# Patient Record
Sex: Female | Born: 1945 | Hispanic: Yes | Smoking: Never smoker
Health system: Southern US, Community
[De-identification: ages and names within clinical notes are randomized; demographics above are authoritative.]

## PROBLEM LIST (undated history)

## (undated) DIAGNOSIS — I10 Essential (primary) hypertension: Secondary | ICD-10-CM

## (undated) DIAGNOSIS — G8929 Other chronic pain: Secondary | ICD-10-CM

## (undated) DIAGNOSIS — C9590 Leukemia, unspecified not having achieved remission: Secondary | ICD-10-CM

## (undated) DIAGNOSIS — M549 Dorsalgia, unspecified: Secondary | ICD-10-CM

## (undated) DIAGNOSIS — M542 Cervicalgia: Secondary | ICD-10-CM

## (undated) HISTORY — PX: CERVICAL SPINE SURGERY: SHX589

---

## 2015-11-29 DIAGNOSIS — Z9889 Other specified postprocedural states: Secondary | ICD-10-CM | POA: Insufficient documentation

## 2015-11-29 DIAGNOSIS — M47812 Spondylosis without myelopathy or radiculopathy, cervical region: Secondary | ICD-10-CM | POA: Insufficient documentation

## 2016-01-01 ENCOUNTER — Encounter (HOSPITAL_COMMUNITY): Payer: Self-pay | Admitting: *Deleted

## 2016-01-01 ENCOUNTER — Emergency Department (HOSPITAL_COMMUNITY): Payer: Medicare Other

## 2016-01-01 ENCOUNTER — Observation Stay (HOSPITAL_COMMUNITY)
Admission: EM | Admit: 2016-01-01 | Discharge: 2016-01-04 | Disposition: A | Discharge status: Home / Self Care | Payer: Medicare Other | Attending: Internal Medicine | Admitting: Internal Medicine

## 2016-01-01 DIAGNOSIS — R079 Chest pain, unspecified: Secondary | ICD-10-CM | POA: Diagnosis present

## 2016-01-01 DIAGNOSIS — R0789 Other chest pain: Principal | ICD-10-CM | POA: Insufficient documentation

## 2016-01-01 DIAGNOSIS — I10 Essential (primary) hypertension: Secondary | ICD-10-CM | POA: Diagnosis not present

## 2016-01-01 DIAGNOSIS — K838 Other specified diseases of biliary tract: Secondary | ICD-10-CM | POA: Insufficient documentation

## 2016-01-01 DIAGNOSIS — C9110 Chronic lymphocytic leukemia of B-cell type not having achieved remission: Secondary | ICD-10-CM

## 2016-01-01 DIAGNOSIS — K219 Gastro-esophageal reflux disease without esophagitis: Secondary | ICD-10-CM | POA: Diagnosis present

## 2016-01-01 DIAGNOSIS — D649 Anemia, unspecified: Secondary | ICD-10-CM | POA: Diagnosis not present

## 2016-01-01 DIAGNOSIS — R0600 Dyspnea, unspecified: Secondary | ICD-10-CM | POA: Diagnosis present

## 2016-01-01 DIAGNOSIS — C9510 Chronic leukemia of unspecified cell type not having achieved remission: Secondary | ICD-10-CM

## 2016-01-01 DIAGNOSIS — C9590 Leukemia, unspecified not having achieved remission: Secondary | ICD-10-CM | POA: Diagnosis present

## 2016-01-01 DIAGNOSIS — R221 Localized swelling, mass and lump, neck: Secondary | ICD-10-CM

## 2016-01-01 DIAGNOSIS — C9112 Chronic lymphocytic leukemia of B-cell type in relapse: Secondary | ICD-10-CM

## 2016-01-01 HISTORY — DX: Dorsalgia, unspecified: M54.9

## 2016-01-01 HISTORY — DX: Other chronic pain: G89.29

## 2016-01-01 HISTORY — DX: Essential (primary) hypertension: I10

## 2016-01-01 HISTORY — DX: Leukemia, unspecified not having achieved remission: C95.90

## 2016-01-01 HISTORY — DX: Cervicalgia: M54.2

## 2016-01-01 LAB — BASIC METABOLIC PANEL
ANION GAP: 8 (ref 5–15)
BUN: 25 mg/dL — ABNORMAL HIGH (ref 6–20)
CALCIUM: 9.6 mg/dL (ref 8.9–10.3)
CO2: 23 mmol/L (ref 22–32)
Chloride: 105 mmol/L (ref 101–111)
Creatinine, Ser: 0.65 mg/dL (ref 0.44–1.00)
Glucose, Bld: 121 mg/dL — ABNORMAL HIGH (ref 65–99)
Potassium: 4.3 mmol/L (ref 3.5–5.1)
SODIUM: 136 mmol/L (ref 135–145)

## 2016-01-01 LAB — URINALYSIS, ROUTINE W REFLEX MICROSCOPIC
BILIRUBIN URINE: NEGATIVE
Glucose, UA: NEGATIVE mg/dL
KETONES UR: NEGATIVE mg/dL
LEUKOCYTES UA: NEGATIVE
NITRITE: NEGATIVE
PROTEIN: NEGATIVE mg/dL
Specific Gravity, Urine: 1.037 — ABNORMAL HIGH (ref 1.005–1.030)
pH: 7 (ref 5.0–8.0)

## 2016-01-01 LAB — CBC
HCT: 28.9 % — ABNORMAL LOW (ref 36.0–46.0)
HEMOGLOBIN: 10 g/dL — AB (ref 12.0–15.0)
MCH: 29.3 pg (ref 26.0–34.0)
MCHC: 34.6 g/dL (ref 30.0–36.0)
MCV: 84.8 fL (ref 78.0–100.0)
PLATELETS: 189 10*3/uL (ref 150–400)
RBC: 3.41 MIL/uL — AB (ref 3.87–5.11)
RDW: 15.1 % (ref 11.5–15.5)
WBC: 48.4 10*3/uL — AB (ref 4.0–10.5)

## 2016-01-01 LAB — URINE MICROSCOPIC-ADD ON

## 2016-01-01 LAB — TROPONIN I: Troponin I: <0.03 ng/mL (ref <0.03)

## 2016-01-01 LAB — I-STAT TROPONIN, ED: TROPONIN I, POC: 0 ng/mL (ref 0.00–0.08)

## 2016-01-01 MED ORDER — ACETAMINOPHEN 325 MG PO TABS
650.0000 mg | ORAL_TABLET | ORAL | Status: DC | PRN
Start: 2016-01-01 — End: 2016-01-04
  Administered 2016-01-02 – 2016-01-03 (×3): 650 mg via ORAL
  Filled 2016-01-01 (×3): qty 2

## 2016-01-01 MED ORDER — NIFEDIPINE ER OSMOTIC RELEASE 30 MG PO TB24
30.0000 mg | ORAL_TABLET | Freq: Every day | ORAL | Status: DC
  Administered 2016-01-02 – 2016-01-04 (×3): 30 mg via ORAL
  Filled 2016-01-01 (×3): qty 1

## 2016-01-01 MED ORDER — PANTOPRAZOLE SODIUM 40 MG PO TBEC
40.0000 mg | DELAYED_RELEASE_TABLET | Freq: Two times a day (BID) | ORAL | Status: DC
  Administered 2016-01-01 – 2016-01-04 (×6): 40 mg via ORAL
  Filled 2016-01-01 (×6): qty 1

## 2016-01-01 MED ORDER — MORPHINE SULFATE (PF) 2 MG/ML IV SOLN
2.0000 mg | INTRAVENOUS | Status: DC | PRN
  Administered 2016-01-02 – 2016-01-03 (×4): 2 mg via INTRAVENOUS
  Filled 2016-01-01 (×5): qty 1

## 2016-01-01 MED ORDER — ONDANSETRON HCL 4 MG/2ML IJ SOLN
4.0000 mg | Freq: Four times a day (QID) | INTRAMUSCULAR | Status: DC | PRN
  Administered 2016-01-02 – 2016-01-03 (×2): 4 mg via INTRAVENOUS
  Filled 2016-01-01 (×2): qty 2

## 2016-01-01 MED ORDER — ASPIRIN 81 MG PO CHEW
324.0000 mg | CHEWABLE_TABLET | Freq: Once | ORAL | Status: AC
  Administered 2016-01-01: 324 mg via ORAL
  Filled 2016-01-01: qty 4

## 2016-01-01 MED ORDER — IOPAMIDOL (ISOVUE-370) INJECTION 76%
INTRAVENOUS | Status: AC
  Administered 2016-01-01: 100 mL
  Filled 2016-01-01: qty 100

## 2016-01-01 MED ORDER — IPRATROPIUM-ALBUTEROL 0.5-2.5 (3) MG/3ML IN SOLN: 3.0000 mL | RESPIRATORY_TRACT | Status: DC | PRN

## 2016-01-01 MED ORDER — ENOXAPARIN SODIUM 40 MG/0.4ML ~~LOC~~ SOLN
40.0000 mg | Freq: Every day | SUBCUTANEOUS | Status: DC
  Administered 2016-01-01 – 2016-01-03 (×3): 40 mg via SUBCUTANEOUS
  Filled 2016-01-01 (×3): qty 0.4

## 2016-01-01 MED ORDER — SODIUM CHLORIDE 0.9 % IV SOLN
INTRAVENOUS | Status: DC
  Administered 2016-01-01: 23:00:00 via INTRAVENOUS

## 2016-01-01 MED ORDER — GI COCKTAIL ~~LOC~~: 30.0000 mL | Freq: Four times a day (QID) | ORAL | Status: DC | PRN

## 2016-01-01 NOTE — H&P (Signed)
History and Physical    Bethany Travis [id number] DOB: 10/27/1945 DOA: 01/01/2016  Referring MD/NP/PA: Dr. Thurnell Garbe PCP: Pcp Not In System  Patient coming from: Family's home that the patient is visiting   Chief Complaint: Dizziness, chest discomfort, and shortness of breath  HPI: Bethany Travis is a 70 y.o. 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 Patient is Spanish-speaking and her nephew acts as a Optometrist. She came to [place] by automobile to visit family from [place], [place].  She reports having this left-sided chest pressure/discomfort whenever standing or exerting herself. Feels like she may pass out, but she has never lost consciousness. Symptoms will last anywhere from 30 minutes to an hour before self resolving. Patient denies trying anything besides resting to alleviate symptoms. Symptoms have not occurred while resting. Associated symptoms during these episodes include fatigue, shortness of breath, palpitations, and dizziness. She denies any cough, hemoptysis, dysuria, abdominal pain, diaphoresis, fevers, calf swelling, or dysuria. Patient denies any significant history of tobacco, alcohol, or illicit drug use. She is not on any blood thinners, but takes a 81 mg aspirin daily.She reports having okay appetite with normal oral intake. Patient has had a known diagnosis of leukemia which she says her primary care provider is following with lab work every 3 months or so. She declined any treatment so because she was fearful of chemotherapy. Of note she is scheduled to have a back operation in the near future.   ED Course: On admission patient was evaluated and seen to be afebrile with vitals otherwise within normal limits.lab work revealed WBC of 40.4, hemoglobin 10,  hematocrit 28.9, platelets 189, BUN 25, creatinine 0.65, glucose 121, and troponin 0. EKG showed no acute abnormalities. Urinalysis was negative. CT angiogram of the chest and due to the history of recent travel, but it did not show any acute signs of a pulmonary embolus, but showed enlarged retropectoral and axillary lymph nodes. TRH called  admit for monitoring overnight.   Review of Systems: As per HPI otherwise 10 point review of systems negative.   Past Medical History  Diagnosis Date  . Hypertension   . Chronic neck and back pain   . Leukemia ([place])     No chemo    Past Surgical History  Procedure Laterality Date  . Cervical spine surgery       reports that she has never smoked. She does not have any smokeless tobacco history on file. Her alcohol and drug histories are not on file.  No Known Allergies  History reviewed. No pertinent family history.  Prior to Admission medications   Not on File    Physical Exam: Constitutional:Elderly female in NAD, calm, comfortable Filed Vitals:   01/01/16 1832 01/01/16 1839 01/01/16 1930 01/01/16 2030  BP:  134/62 118/63 118/52  Pulse: 81 84 84 73  Temp:      TempSrc:      Resp: 20 17 18 19   SpO2: 99% 98% 98% 99%   Eyes: PERRL, lids and conjunctivae normal ENMT: Mucous membranes are moist. Posterior pharynx clear of any exudate or lesions.Normal dentition.  Neck: normal, supple, no masses, no thyromegaly Respiratory: clear to auscultation bilaterally, no wheezing, no crackles. Normal respiratory effort. No accessory muscle use.  Cardiovascular: Regular rate and rhythm, no murmurs / rubs / gallops. No extremity edema. 2+ pedal pulses.  No carotid bruits.  Abdomen: no tenderness, no masses palpated. No hepatosplenomegaly. Bowel sounds positive.  Musculoskeletal: no clubbing / cyanosis. No joint deformity upper and lower extremities. Good ROM, no contractures. Normal muscle tone.  Skin: no rashes, lesions, ulcers. No  induration Neurologic: CN 2-12 grossly intact. Sensation intact, DTR normal. Strength 5/5 in all 4.  Psychiatric: Normal judgment and insight. Alert and oriented x 3. Normal mood.     Labs on Admission: I have personally reviewed following labs and imaging studies  CBC:  Recent Labs Lab 01/01/16 1720  WBC 48.4*  HGB 10.0*  HCT 28.9*  MCV 84.8  PLT 99991111   Basic Metabolic Panel:  Recent Labs Lab 01/01/16 1720  NA 136  K 4.3  CL 105  CO2 23  GLUCOSE 121*  BUN 25*  CREATININE 0.65  CALCIUM 9.6   GFR: CrCl cannot be calculated (Unknown ideal weight.). Liver Function Tests: No results for input(s): AST, ALT, ALKPHOS, BILITOT, PROT, ALBUMIN in the last 168 hours. No results for input(s): LIPASE, AMYLASE in the last 168 hours. No results for input(s): AMMONIA in the last 168 hours. Coagulation Profile: No results for input(s): INR, PROTIME in the last 168 hours. Cardiac Enzymes: No results for input(s): CKTOTAL, CKMB, CKMBINDEX, TROPONINI in the last 168 hours. BNP (last 3 results) No results for input(s): PROBNP in the last 8760 hours. HbA1C: No results for input(s): HGBA1C in the last 72 hours. CBG: No results for input(s): GLUCAP in the last 168 hours. Lipid Profile: No results for input(s): CHOL, HDL, LDLCALC, TRIG, CHOLHDL, LDLDIRECT in the last 72 hours. Thyroid Function Tests: No results for input(s): TSH, T4TOTAL, FREET4, T3FREE, THYROIDAB in the last 72 hours. Anemia Panel: No results for input(s): VITAMINB12, FOLATE, FERRITIN, TIBC, IRON, RETICCTPCT in the last 72 hours. Urine analysis:    Component Value Date/Time   COLORURINE YELLOW 01/01/2016 1940   APPEARANCEUR CLEAR 01/01/2016 1940   LABSPEC 1.037* 01/01/2016 1940   PHURINE 7.0 01/01/2016 1940   GLUCOSEU NEGATIVE 01/01/2016 1940   HGBUR TRACE* 01/01/2016 1940   [place] NEGATIVE 01/01/2016 1940   [place] NEGATIVE 01/01/2016 1940   PROTEINUR NEGATIVE 01/01/2016 1940   NITRITE NEGATIVE  01/01/2016 1940   LEUKOCYTESUR NEGATIVE 01/01/2016 1940   Sepsis Labs: No results found for this or any previous visit (from the past 240 hour(s)).   Radiological Exams on Admission: Dg Chest 2 View  01/01/2016  CLINICAL DATA:  Chest pain for 3 days, dizziness EXAM: CHEST  2 VIEW COMPARISON:  None. FINDINGS: Normal mediastinum and cardiac silhouette. Normal pulmonary vasculature. No evidence of effusion, infiltrate, or pneumothorax. No acute bony abnormality. IMPRESSION: No acute cardiopulmonary process. Electronically Signed   By: Suzy Bouchard M.D.   On: 01/01/2016 17:54   Ct Angio Chest Pe W/cm &/or Wo Cm  01/01/2016  CLINICAL DATA:  Chest pain and shortness of breath for 2 days, history hypertension, leukemia EXAM: CT ANGIOGRAPHY CHEST WITH CONTRAST TECHNIQUE: Multidetector CT imaging of the chest was performed using the standard protocol during bolus administration of intravenous contrast. Multiplanar CT image reconstructions and MIPs were obtained to evaluate the vascular anatomy. CONTRAST:  80 cc Isovue 370 IV COMPARISON:  None FINDINGS: Cardiovascular: Atherosclerotic calcifications aorta without aneurysm or dissection. Pulmonary arteries patent. No evidence of pulmonary embolism. Upper normal size of cardiac chambers. Mediastinum/Nodes: Esophagus unremarkable. Upper normal sized RIGHT retropectoral nodes up to 10 mm and 9 mm diameter. Base of cervical region unremarkable. Numerous normal, upper normal, and borderline enlarged axillary  nodes bilaterally. No mediastinal or hilar adenopathy. Lungs/Pleura: Dependent atelectasis in the posterior lower lobes. Remaining lungs clear without infiltrate, pleural effusion or pneumothorax. Upper Abdomen: Question minimal splenic enlargement though spleen incompletely visualized. Remaining visualized upper abdomen normal appearance. Musculoskeletal: No acute thoracic osseous abnormalities. Review of the MIP images confirms the above findings. IMPRESSION: No  evidence of pulmonary embolism. Dependent atelectasis in the posterior lower lobes bilaterally. Aortic atherosclerosis. Upper normal sized to borderline enlarged retropectoral and axillary lymph nodes. Electronically Signed   By: Lavonia Dana M.D.   On: 01/01/2016 19:36    EKG: Independently reviewed.   Assessment/Plan Chest pain: Acute. Symptoms have been going on over the last 2-3 days with chest discomfort, lightheadedness, and shortness of breath. CT angiogram of chest shows no acute signs of PE.  - Admit to a telemetry bed - Trend cardiac troponins  - Repeat EKG at 6 AM - Check echocardiogram in a.m. - Check lipid panel - May warrant formal consult to cardiology in a.m.  Shortness of breath/dyspnea - Nasal cannula oxygen to keep O2 saturations greater than 92% if needed - DuoNeb's when necessary  Leukocytosis with history of leukemia: WBC count 48.4 on admission. Patient not receiving treatment due to fear chemotherapy. CT angiogram showing enlarged retropectoral and axillary lymph nodes. Question if patient's leukemia is playing a part in her acute presentation and symptoms. - Follow-up repeat CBC with differential in a.m. - May want to curbside oncology in a.m.   Anemia: Hemoglobin 10 on admission. Baseline patient unknown. - Continue to monitor   Question dehydration: With elevated BUN to creatinine ratio. - IV fluids of normal saline at 100 mL per hour - Check orthostatic vital signs - Repeat BMP in a.m.  Essential hypertension - Continue nifedipine  GERD - continue Protonix  DVT prophylaxis: lovenox Code Status: Full Family Communication: Plan discussed with patient's nephew present at bedside Disposition Plan: Possible discharge home depending on pending studies Consults called: None  Admission status: Telemetry observation  Norval Morton MD [facility] Pager [phone]  If 7PM-7AM, please contact night-coverage [email or web address] Password  [email or web address]  01/01/2016, 9:18 PM

## 2016-01-01 NOTE — ED Provider Notes (Signed)
CSN: [id number]     Arrival date & time 01/01/16  1704 History   First MD Initiated Contact with Patient 01/01/16 1759     Chief Complaint  Patient presents with  . Chest Pain  . Dizziness      HPI Pt was seen at 1810.  Per pt and her family, c/o gradual onset and persistence of multiple intermittent episodes of chest "pain" for the past 3 days. Has been associated with SOB, fatigue and lightheadedness. Describes the CP as "pressure." Symptoms worsen on exertion. Symptoms last approximately 1 hour before resolving with rest. Pt endorses hx of recent car travel from [place] to [place] in the past week. Denies palpitations, no back pain, no abd pain, no N/V/D, no cough, no fevers, no calf/LE pain or unilateral swelling.    Past Medical History  Diagnosis Date  . Hypertension   . Chronic neck and back pain   . Leukemia ([place])     No chemo   Past Surgical History  Procedure Laterality Date  . Cervical spine surgery      Social History  Substance Use Topics  . Smoking status: Never Smoker   . Smokeless tobacco: None  . Alcohol Use: None    Review of Systems ROS: Statement: All systems negative except as marked or noted in the HPI; Constitutional: Negative for fever and chills. +generalized fatigue.; ; Eyes: Negative for eye pain, redness and discharge. ; ; ENMT: Negative for ear pain, hoarseness, nasal congestion, sinus pressure and sore throat. ; ; Cardiovascular: +CP, SOB. Negative for palpitations, diaphoresis, and peripheral edema. ; ; Respiratory: Negative for cough, wheezing and stridor. ; ; Gastrointestinal: Negative for nausea, vomiting, diarrhea, abdominal pain, blood in stool, hematemesis, jaundice and rectal bleeding. ; ; Genitourinary: Negative for dysuria, flank pain and hematuria. ; ; Musculoskeletal: Negative for back pain and neck pain. Negative for swelling and trauma.; ; Skin: Negative for pruritus, rash, abrasions, blisters, bruising and skin lesion.; ; Neuro:  +lightheadedness. Negative for headache and neck stiffness. Negative for weakness, altered level of consciousness, altered mental status, extremity weakness, paresthesias, involuntary movement, seizure and syncope.      Allergies  Review of patient's allergies indicates no known allergies.  Home Medications   Prior to Admission medications   Not on File   BP 118/64 mmHg  Pulse 86  Temp(Src) 98 F (36.7 C) (Oral)  Resp 20  SpO2 98%   18:40:15 Orthostatic Vital Signs DT  Orthostatic Lying  - BP- Lying: 123/59 mmHg ; Pulse- Lying: 83  Orthostatic Sitting - BP- Sitting: 134/62 mmHg ; Pulse- Sitting: 79  Orthostatic Standing at 0 minutes - BP- Standing at 0 minutes: 140/96 mmHg ; Pulse- Standing at 0 minutes: 92      Physical Exam  1815: Physical examination:  Nursing notes reviewed; Vital signs and O2 SAT reviewed;  Constitutional: Well developed, Well nourished, Well hydrated, In no acute distress; Head:  Normocephalic, atraumatic; Eyes: EOMI, PERRL, No scleral icterus; ENMT: Mouth and pharynx normal, Mucous membranes moist; Neck: Supple, Full range of motion, No lymphadenopathy; Cardiovascular: Regular rate and rhythm, No gallop; Respiratory: Breath sounds clear & equal bilaterally, No wheezes.  Speaking full sentences with ease, Normal respiratory effort/excursion; Chest: Nontender, Movement normal; Abdomen: Soft, Nontender, Nondistended, Normal bowel sounds; Genitourinary: No CVA tenderness; Extremities: Pulses normal, No tenderness, No edema, No calf edema or asymmetry.; Neuro: AA&Ox3, Major CN grossly intact. No facial droop. Speech clear. No gross focal motor or sensory deficits in extremities.; Skin: Color  normal, Warm, Dry.   ED Course  Procedures (including critical care time) Labs Review  Imaging Review  I have personally reviewed and evaluated these images and lab results as part of my medical decision-making.   EKG Interpretation   Date/Time:  Wednesday January 01 2016  18:37:26 EDT Ventricular Rate:  82 PR Interval:    QRS Duration: 146 QT Interval:  406 QTC Calculation: 475 R Axis:     Text Interpretation:  Sinus arrhythmia Right bundle branch block Baseline  wander Artifact No old tracing to compare Confirmed by [facility]  MD,  Nunzio Cory [phone]) on 01/01/2016 10:21:58 PM      MDM  MDM Reviewed: previous chart, nursing note and vitals Interpretation: labs, ECG, x-ray and CT scan     ED ECG REPORT   Date: 01/01/2016  Rate: 97  Rhythm: normal sinus rhythm and sinus arrhythmia  QRS Axis: normal  Intervals: normal  ST/T Wave abnormalities: normal  Conduction Disutrbances:right bundle branch block  Narrative Interpretation:   Old EKG Reviewed: none available.   Results for orders placed or performed during the hospital encounter of [id number]  Basic metabolic panel  Result Value Ref Range   Sodium 136 135 - 145 mmol/L   Potassium 4.3 3.5 - 5.1 mmol/L   Chloride 105 101 - 111 mmol/L   CO2 23 22 - 32 mmol/L   Glucose, Bld 121 (H) 65 - 99 mg/dL   BUN 25 (H) 6 - 20 mg/dL   Creatinine, Ser 0.65 0.44 - 1.00 mg/dL   Calcium 9.6 8.9 - 10.3 mg/dL   GFR calc non Af Amer >60 >60 mL/min   GFR calc Af Amer >60 >60 mL/min   Anion gap 8 5 - 15  CBC  Result Value Ref Range   WBC 48.4 (H) 4.0 - 10.5 K/uL   RBC 3.41 (L) 3.87 - 5.11 MIL/uL   Hemoglobin 10.0 (L) 12.0 - 15.0 g/dL   HCT 28.9 (L) 36.0 - 46.0 %   MCV 84.8 78.0 - 100.0 fL   MCH 29.3 26.0 - 34.0 pg   MCHC 34.6 30.0 - 36.0 g/dL   RDW 15.1 11.5 - 15.5 %   Platelets 189 150 - 400 K/uL  Urinalysis, Routine w reflex microscopic  Result Value Ref Range   Color, Urine YELLOW YELLOW   APPearance CLEAR CLEAR   Specific Gravity, Urine 1.037 (H) 1.005 - 1.030   pH 7.0 5.0 - 8.0   Glucose, UA NEGATIVE NEGATIVE mg/dL   Hgb urine dipstick TRACE (A) NEGATIVE   Bilirubin Urine NEGATIVE NEGATIVE   Ketones, ur NEGATIVE NEGATIVE mg/dL   Protein, ur NEGATIVE NEGATIVE mg/dL   Nitrite NEGATIVE NEGATIVE    Leukocytes, UA NEGATIVE NEGATIVE  Urine microscopic-add on  Result Value Ref Range   Squamous Epithelial / LPF 0-5 (A) NONE SEEN   WBC, UA 0-5 0 - 5 WBC/hpf   RBC / HPF 0-5 0 - 5 RBC/hpf   Bacteria, UA RARE (A) NONE SEEN  I-stat troponin, ED  Result Value Ref Range   Troponin i, poc 0.00 0.00 - 0.08 ng/mL   Comment 3           Dg Chest 2 View 01/01/2016  CLINICAL DATA:  Chest pain for 3 days, dizziness EXAM: CHEST  2 VIEW COMPARISON:  None. FINDINGS: Normal mediastinum and cardiac silhouette. Normal pulmonary vasculature. No evidence of effusion, infiltrate, or pneumothorax. No acute bony abnormality. IMPRESSION: No acute cardiopulmonary process. Electronically Signed   By: Suzy Bouchard  M.D.   On: 01/01/2016 17:54   Ct Angio Chest Pe W/cm &/or Wo Cm 01/01/2016  CLINICAL DATA:  Chest pain and shortness of breath for 2 days, history hypertension, leukemia EXAM: CT ANGIOGRAPHY CHEST WITH CONTRAST TECHNIQUE: Multidetector CT imaging of the chest was performed using the standard protocol during bolus administration of intravenous contrast. Multiplanar CT image reconstructions and MIPs were obtained to evaluate the vascular anatomy. CONTRAST:  80 cc Isovue 370 IV COMPARISON:  None FINDINGS: Cardiovascular: Atherosclerotic calcifications aorta without aneurysm or dissection. Pulmonary arteries patent. No evidence of pulmonary embolism. Upper normal size of cardiac chambers. Mediastinum/Nodes: Esophagus unremarkable. Upper normal sized RIGHT retropectoral nodes up to 10 mm and 9 mm diameter. Base of cervical region unremarkable. Numerous normal, upper normal, and borderline enlarged axillary nodes bilaterally. No mediastinal or hilar adenopathy. Lungs/Pleura: Dependent atelectasis in the posterior lower lobes. Remaining lungs clear without infiltrate, pleural effusion or pneumothorax. Upper Abdomen: Question minimal splenic enlargement though spleen incompletely visualized. Remaining visualized upper  abdomen normal appearance. Musculoskeletal: No acute thoracic osseous abnormalities. Review of the MIP images confirms the above findings. IMPRESSION: No evidence of pulmonary embolism. Dependent atelectasis in the posterior lower lobes bilaterally. Aortic atherosclerosis. Upper normal sized to borderline enlarged retropectoral and axillary lymph nodes. Electronically Signed   By: Lavonia Dana M.D.   On: 01/01/2016 19:36    2010:  Denies symptoms while in the ED. Not orthostatic on VS. Concerning HPI for ACS as cause for symptoms; will observation admit. Dx and testing d/w pt and family.  Questions answered.  Verb understanding, agreeable to admit.  T/C to [facility]. Tamala Julian, case discussed, including:  HPI, pertinent PM/SHx, VS/PE, dx testing, ED course and treatment:  Agreeable to admit, requests to write temporary orders, obtain observation tele bed to team MCAdmits.   Francine Graven, DO 01/02/16 [phone]

## 2016-01-01 NOTE — ED Notes (Signed)
Pt in c/o chest pain and shortness of breath for the last few days, also dizziness, reports fatigue, shortness of breath is worse with exertion, no distress noted

## 2016-01-02 ENCOUNTER — Observation Stay (HOSPITAL_COMMUNITY): Payer: Medicare Other

## 2016-01-02 ENCOUNTER — Encounter (HOSPITAL_COMMUNITY): Payer: Self-pay | Admitting: Radiology

## 2016-01-02 ENCOUNTER — Observation Stay (HOSPITAL_BASED_OUTPATIENT_CLINIC_OR_DEPARTMENT_OTHER): Payer: Medicare Other

## 2016-01-02 DIAGNOSIS — K219 Gastro-esophageal reflux disease without esophagitis: Secondary | ICD-10-CM | POA: Diagnosis present

## 2016-01-02 DIAGNOSIS — C9590 Leukemia, unspecified not having achieved remission: Secondary | ICD-10-CM | POA: Diagnosis present

## 2016-01-02 DIAGNOSIS — K838 Other specified diseases of biliary tract: Secondary | ICD-10-CM | POA: Diagnosis not present

## 2016-01-02 DIAGNOSIS — R079 Chest pain, unspecified: Secondary | ICD-10-CM

## 2016-01-02 DIAGNOSIS — R0789 Other chest pain: Secondary | ICD-10-CM | POA: Diagnosis not present

## 2016-01-02 DIAGNOSIS — I10 Essential (primary) hypertension: Secondary | ICD-10-CM | POA: Diagnosis not present

## 2016-01-02 DIAGNOSIS — C91Z0 Other lymphoid leukemia not having achieved remission: Secondary | ICD-10-CM | POA: Diagnosis not present

## 2016-01-02 DIAGNOSIS — D598 Other acquired hemolytic anemias: Secondary | ICD-10-CM | POA: Diagnosis not present

## 2016-01-02 DIAGNOSIS — I350 Nonrheumatic aortic (valve) stenosis: Secondary | ICD-10-CM | POA: Diagnosis not present

## 2016-01-02 DIAGNOSIS — D649 Anemia, unspecified: Secondary | ICD-10-CM | POA: Diagnosis present

## 2016-01-02 DIAGNOSIS — D594 Other nonautoimmune hemolytic anemias: Secondary | ICD-10-CM | POA: Diagnosis not present

## 2016-01-02 DIAGNOSIS — R0600 Dyspnea, unspecified: Secondary | ICD-10-CM | POA: Diagnosis present

## 2016-01-02 LAB — CBC WITH DIFFERENTIAL/PLATELET
BASOS ABS: 0 10*3/uL (ref 0.0–0.1)
Basophils Relative: 0 %
EOS PCT: 1 %
Eosinophils Absolute: 0.4 10*3/uL (ref 0.0–0.7)
HEMATOCRIT: 23.8 % — AB (ref 36.0–46.0)
HEMOGLOBIN: 8.2 g/dL — AB (ref 12.0–15.0)
LYMPHS ABS: 28.8 10*3/uL — AB (ref 0.7–4.0)
LYMPHS PCT: 80 %
MCH: 28.7 pg (ref 26.0–34.0)
MCHC: 34.5 g/dL (ref 30.0–36.0)
MCV: 83.2 fL (ref 78.0–100.0)
MONOS PCT: 7 %
Monocytes Absolute: 2.5 10*3/uL — ABNORMAL HIGH (ref 0.1–1.0)
NEUTROS ABS: 4.3 10*3/uL (ref 1.7–7.7)
Neutrophils Relative %: 12 %
Platelets: 156 10*3/uL (ref 150–400)
RBC: 2.86 MIL/uL — ABNORMAL LOW (ref 3.87–5.11)
RDW: 15.1 % (ref 11.5–15.5)
WBC: 36 10*3/uL — ABNORMAL HIGH (ref 4.0–10.5)

## 2016-01-02 LAB — LIPID PANEL
CHOLESTEROL: 143 mg/dL (ref 0–200)
HDL: 32 mg/dL — AB (ref >40)
LDL Cholesterol: 91 mg/dL (ref 0–99)
TRIGLYCERIDES: 102 mg/dL (ref <150)
Total CHOL/HDL Ratio: 4.5 RATIO
VLDL: 20 mg/dL (ref 0–40)

## 2016-01-02 LAB — ECHOCARDIOGRAM COMPLETE
CHL CUP MV DEC (S): 257
EERAT: 13.73
EWDT: 257 ms
FS: 25 % — AB (ref 28–44)
Height: 60 in
IVS/LV PW RATIO, ED: 1.17
LA diam end sys: 42 mm
LA diam index: 2.4 cm/m2
LA vol A4C: 34.4 ml
LASIZE: 42 mm
LDCA: 3.14 cm2
LV TDI E'LATERAL: 9.32
LV e' LATERAL: 9.32 cm/s
LVEEAVG: 13.73
LVEEMED: 13.73
LVOTD: 20 mm
MV pk A vel: 130 m/s
MVPG: 7 mmHg
MVPKEVEL: 128 m/s
PW: 13.3 mm — AB (ref 0.6–1.1)
RV TAPSE: 20.7 mm
TDI e' medial: 5.98
Weight: 2758.4 oz

## 2016-01-02 LAB — COMPREHENSIVE METABOLIC PANEL
ALBUMIN: 3.4 g/dL — AB (ref 3.5–5.0)
ALK PHOS: 66 U/L (ref 38–126)
ALT: 21 U/L (ref 14–54)
ANION GAP: 6 (ref 5–15)
AST: 24 U/L (ref 15–41)
BILIRUBIN TOTAL: 4 mg/dL — AB (ref 0.3–1.2)
BUN: 20 mg/dL (ref 6–20)
CALCIUM: 8.8 mg/dL — AB (ref 8.9–10.3)
CO2: 25 mmol/L (ref 22–32)
Chloride: 107 mmol/L (ref 101–111)
Creatinine, Ser: 0.58 mg/dL (ref 0.44–1.00)
GFR calc Af Amer: >60 mL/min (ref >60)
GLUCOSE: 113 mg/dL — AB (ref 65–99)
Potassium: 3.5 mmol/L (ref 3.5–5.1)
Sodium: 138 mmol/L (ref 135–145)
TOTAL PROTEIN: 5.7 g/dL — AB (ref 6.5–8.1)

## 2016-01-02 LAB — TROPONIN I: Troponin I: <0.03 ng/mL (ref <0.03)

## 2016-01-02 LAB — ABO/RH: ABO/RH(D): A POS

## 2016-01-02 LAB — LACTATE DEHYDROGENASE: LDH: 346 U/L — AB (ref 98–192)

## 2016-01-02 MED ORDER — IOPAMIDOL (ISOVUE-300) INJECTION 61%
INTRAVENOUS | Status: AC
  Administered 2016-01-02: 75 mL
  Filled 2016-01-02: qty 75

## 2016-01-02 MED ORDER — REGADENOSON 0.4 MG/5ML IV SOLN
0.4000 mg | Freq: Once | INTRAVENOUS | Status: AC
  Administered 2016-01-03: 0.4 mg via INTRAVENOUS
  Filled 2016-01-02: qty 5

## 2016-01-02 MED ORDER — PIPERACILLIN-TAZOBACTAM 3.375 G IVPB
3.3750 g | Freq: Three times a day (TID) | INTRAVENOUS | Status: DC
  Administered 2016-01-03 – 2016-01-04 (×4): 3.375 g via INTRAVENOUS
  Filled 2016-01-02 (×6): qty 50

## 2016-01-02 MED ORDER — PIPERACILLIN-TAZOBACTAM 3.375 G IVPB
3.3750 g | Freq: Three times a day (TID) | INTRAVENOUS | Status: DC
  Administered 2016-01-02: 3.375 g via INTRAVENOUS
  Filled 2016-01-02 (×2): qty 50

## 2016-01-02 MED ORDER — SODIUM CHLORIDE 0.9 % IV SOLN: Freq: Once | INTRAVENOUS | Status: DC

## 2016-01-02 MED ORDER — PIPERACILLIN-TAZOBACTAM 3.375 G IVPB 30 MIN: 3.3750 g | Freq: Three times a day (TID) | INTRAVENOUS | Status: DC

## 2016-01-02 NOTE — Care Management Note (Signed)
Case Management Note  Patient Details  Name: Bethany Travis MRN: [id number] Date of Birth: [birth year]  Subjective/Objective:      Chest pain             Action/Plan: Discharge Planning:  NCM spoke to grand-dtr, Kristi at bedside. States pt's son signs all pt documents. Left copy of MOON in room for pt's son to review. Will follow up with to answer any questions regarding notice. Per grand-dtr, pt does not speak [place]. She was scheduled to be in [place] for 3 weeks with plan to travel back to [facility] on Sunday. NCM will continue to follow for dc needs.    Expected Discharge Date:                  Expected Discharge Plan:  Home/Self Care  In-House Referral:  NA  Discharge planning Services  CM Consult  Post Acute Care Choice:  NA Choice offered to:  NA  DME Arranged:  N/A DME Agency:  NA  HH Arranged:  NA HH Agency:  NA  Status of Service:  In process, will continue to follow  If discussed at Long Length of Stay Meetings, dates discussed:    Additional Comments:  Erenest Rasher, RN 01/02/2016, 5:15 PM

## 2016-01-02 NOTE — Care Management Obs Status (Signed)
[facility] NOTIFICATION   Patient Details  Name: Bethany Travis MRN: [id number] Date of Birth: Jan 22, 1946   Medicare Observation Status Notification Given:  Yes    Erenest Rasher, RN 01/02/2016, 5:15 PM

## 2016-01-02 NOTE — Consult Note (Signed)
CARDIOLOGY CONSULT NOTE   Patient ID: Bethany Travis MRN: [id number] DOB/AGE: 1945/11/02 70 y.o.  Admit date: 01/01/2016  Primary Physician   Pcp Not In System Primary Cardiologist   @ [place] Reason for Consultation  Chest pain Requesting Physician  Dr. Coralyn Pear  HPI: Bethany Travis is a 69 y.o. female with a history of AS, HTN, chronic back & pain pain and hx of untreated leukemia who presents for evaluation of dizziness, chest pain/palpitaitons and shortness of breath.   She lives @ [place] and sees cardiologist every 3 months. No hx of cath. Has yearly echo. Last seen 6 months ago. Frequently visit her family in [place] every year. She drove from [place] to [place] last week with family. She is  going back to [place] at the end of August 2017. Patient is Spanish-speaking and her nephew acts as a Optometrist. Hx of palpitation for many years. Told its begins but need frequent monitoring.   His intent to [facility] ER 7/5 for evaluation of worsening dizziness, chest discomfort and shortness of breath for the past 3 days. She complains of left upper chest pain described as achiness questionable associated with palpitation. Dyspnea and dizziness with  exertion. Denies lower extremity edema, orthopnea, PND, syncope, melena, fever, chills. Her symptoms usually last for about 30 minutes and resolves. Recently her blood pressure runs in 160s.   EKG shows sinus rhythm at rate of 97 bpm, right bundle branch block and Q waves in inferior lead and  baseline wandering. Troponin x 3 negative. Lytes normal. Scr normal. LDL 91. WBC 48-->36. Hgb 10-->8.2. Chest x-ray clear. CT angiogram of the chest and due to the history of recent travel, but it did not show any acute signs of a pulmonary embolus, but showed enlarged retropectoral and axillary lymph nodes.   Past Medical History  Diagnosis Date  . Hypertension   . Chronic neck and back pain     . Leukemia ([place])     No chemo     Past Surgical History  Procedure Laterality Date  . Cervical spine surgery      No Known Allergies  I have reviewed the patient's current medications . sodium chloride   Intravenous Once  . enoxaparin (LOVENOX) injection  40 mg Subcutaneous QHS  . NIFEdipine  30 mg Oral Daily  . pantoprazole  40 mg Oral BID     acetaminophen, gi cocktail, ipratropium-albuterol, morphine injection, ondansetron (ZOFRAN) IV  Prior to Admission medications   Medication Sig Start Date End Date Taking? Authorizing Provider  aspirin EC 81 MG tablet Take 81 mg by mouth daily.   Yes Historical Provider, MD  NIFEdipine (PROCARDIA-XL/ADALAT-CC/NIFEDICAL-XL) 30 MG 24 hr tablet Take 30 mg by mouth daily.   Yes Historical Provider, MD  pantoprazole (PROTONIX) 40 MG tablet Take 40 mg by mouth daily.   Yes Historical Provider, MD     Social History   Social History  . Marital Status: Divorced    Spouse Name: N/A  . Number of Children: N/A  . Years of Education: N/A   Occupational History  . Not on file.   Social History Main Topics  . Smoking status: Never Smoker   . Smokeless tobacco: Not on file  . Alcohol Use: Not on file  . Drug Use: Not on file  . Sexual Activity: Not on file   Other Topics Concern  . Not on file   Social History Narrative  . No narrative on file  Family history Father died of some kind arrhythmia. No history of cardiac disease.   ROS:  Full 14 point review of systems complete and found to be negative unless listed above.  Physical Exam: Blood pressure 114/56, pulse 76, temperature 98.8 F (37.1 C), temperature source Oral, resp. rate 18, height 5' (1.524 m), weight 172 lb 6.4 oz (78.2 kg), SpO2 97 %.  General: Well developed, well nourished, female in no acute distress Head: Eyes PERRLA, No xanthomas. Normocephalic and atraumatic, oropharynx without edema or exudate.  Lungs: Resp regular and unlabored, CTA. Heart: RRR no s3,  s4, II/IV murmurs..   Neck: No carotid bruits. No lymphadenopathy. No  JVD. Abdomen: Bowel sounds present, abdomen soft and non-tender without masses or hernias noted. Msk:  No spine or cva tenderness. No weakness, no joint deformities or effusions. Extremities: No clubbing, cyanosis or edema. DP/PT/Radials 2+ and equal bilaterally. Neuro: Alert and oriented X 3. No focal deficits noted. Psych:  Good affect, responds appropriately Skin: No rashes or lesions noted.  Labs:   Lab Results  Component Value Date   WBC 36.0* 01/02/2016   HGB 8.2* 01/02/2016   HCT 23.8* 01/02/2016   MCV 83.2 01/02/2016   PLT 156 01/02/2016   No results for input(s): INR in the last 72 hours.  Recent Labs Lab 01/02/16 0400  NA 138  K 3.5  CL 107  CO2 25  BUN 20  CREATININE 0.58  CALCIUM 8.8*  PROT 5.7*  BILITOT 4.0*  ALKPHOS 66  ALT 21  AST 24  GLUCOSE 113*  ALBUMIN 3.4*   No results found for: MG  Recent Labs  01/01/16 2207 01/02/16 0010 01/02/16 0400  TROPONINI <0.03 <0.03 <0.03    Recent Labs  01/01/16 1725  TROPIPOC 0.00   No results found for: PROBNP Lab Results  Component Value Date   CHOL 143 01/02/2016   HDL 32* 01/02/2016   LDLCALC 91 01/02/2016   TRIG 102 01/02/2016   No results found for: DDIMER No results found for: LIPASE, AMYLASE No results found for: TSH, T4TOTAL, T3FREE, THYROIDAB No results found for: VITAMINB12, FOLATE, FERRITIN, TIBC, IRON, RETICCTPCT  Echo: Pending  ECG:  EKG shows sinus rhythm at rate of 97 bpm, right bundle branch block and Q waves in inferior lead and  baseline wandering. PR interval 162 ms QRS duration 124 ms QT/QTc 366/464 ms P-R-T axes 26 -7 -6  Telemetry: Sinus rhythm at rate of 80s, intermittently goes into 110s   Radiology:  Dg Chest 2 View  01/01/2016  CLINICAL DATA:  Chest pain for 3 days, dizziness EXAM: CHEST  2 VIEW COMPARISON:  None. FINDINGS: Normal mediastinum and cardiac silhouette. Normal pulmonary vasculature.  No evidence of effusion, infiltrate, or pneumothorax. No acute bony abnormality. IMPRESSION: No acute cardiopulmonary process. Electronically Signed   By: Suzy Bouchard M.D.   On: 01/01/2016 17:54   Ct Angio Chest Pe W/cm &/or Wo Cm  01/01/2016  CLINICAL DATA:  Chest pain and shortness of breath for 2 days, history hypertension, leukemia EXAM: CT ANGIOGRAPHY CHEST WITH CONTRAST TECHNIQUE: Multidetector CT imaging of the chest was performed using the standard protocol during bolus administration of intravenous contrast. Multiplanar CT image reconstructions and MIPs were obtained to evaluate the vascular anatomy. CONTRAST:  80 cc Isovue 370 IV COMPARISON:  None FINDINGS: Cardiovascular: Atherosclerotic calcifications aorta without aneurysm or dissection. Pulmonary arteries patent. No evidence of pulmonary embolism. Upper normal size of cardiac chambers. Mediastinum/Nodes: Esophagus unremarkable. Upper normal sized RIGHT retropectoral nodes  up to 10 mm and 9 mm diameter. Base of cervical region unremarkable. Numerous normal, upper normal, and borderline enlarged axillary nodes bilaterally. No mediastinal or hilar adenopathy. Lungs/Pleura: Dependent atelectasis in the posterior lower lobes. Remaining lungs clear without infiltrate, pleural effusion or pneumothorax. Upper Abdomen: Question minimal splenic enlargement though spleen incompletely visualized. Remaining visualized upper abdomen normal appearance. Musculoskeletal: No acute thoracic osseous abnormalities. Review of the MIP images confirms the above findings. IMPRESSION: No evidence of pulmonary embolism. Dependent atelectasis in the posterior lower lobes bilaterally. Aortic atherosclerosis. Upper normal sized to borderline enlarged retropectoral and axillary lymph nodes. Electronically Signed   By: Lavonia Dana M.D.   On: 01/01/2016 19:36    ASSESSMENT AND PLAN:     1. Chest pain - Seems atypical. Could be related to palpitation. Troponin x 3  negative.  EKG shows sinus rhythm at rate of 97 bpm, right bundle branch block and Q waves in inferior lead and  baseline wandering. CTA negative for PE.  Currently chest pain free. Agree with getting echocardiogram. No further ischemic workup unless abnormal echocardiogram. Likely defer invasive evaluation given history of untreated leukemia. Likely medical management.  2. Shortness of breath - Could be related to palpitation/arrhythmia. Patient have a murmur or exam, seems aortic stenosis. Patient endores history of murmur in the past followed by yearly echocardiogram. Pending echo.   3. Palpitations - History for many years. Her palpitation usually occurs once every week and lasts for about a minute or 2. Telemetry shows sinus rhythm at a rate mostly in 80s, however intermittently goes to 110. No arrhythmia noted. Seems her symptoms usually occurs with exertion. Ambulate later today.  4. HTN - Upon presentation blood pressure was 147/74. Improved. Continue nifedipine.   5. Leukemia (HCC)/ Anemia - Per priamry  Signed: Bhagat,Bhavinkumar, PA 01/02/2016, 6:57 AM Pager [phone]  Co-Sign MD  Agree with note by Robbie Lis PA-C  Pt admitted with dizziness, CP and DOE. Visiting family from [place]. Speaks no English. Translation through the courtesy of Dr Coralyn Pear!!! ? H/O AS followed by cardiologists in [place] and [place]. Also H/O untreated leukemia. No prior cardiac Hx.I do not hear AS murmur on exam. EKG SR with RBBB. Enz neg. Labs OK. Tele NSR. Will check 2D echo. If severe AS will need R/L heart cath. Otherwise obs, cycle anz +/- MV  and Rx medically depending on result.   Lorretta Harp, M.D., [place], [facility], [place] [address] [place], [place]  [id number]  [phone] 01/02/2016 8:57 AM

## 2016-01-02 NOTE — Progress Notes (Signed)
*  PRELIMINARY RESULTS* Echocardiogram 2D Echocardiogram has been performed.  Leavy Cella 01/02/2016, 11:05 AM

## 2016-01-02 NOTE — Progress Notes (Addendum)
PROGRESS NOTE    Bethany Travis  [id number] DOB: [birth year] DOA: 01/01/2016 PCP: Pcp Not In System   Brief Narrative:  [name] is a 70 year old female with a past medical history of hypertension, leukemia, originally from the [place] visiting family members in town when she presented with complaints of retrosternal chest pain associated with shortness of breath dizziness and palpitations. She recently drove in from [place], [place]. She reported having history of aortic valve disease and seen a cardiologist in [place] as well as in the [place] Chest x-ray did not reveal acute cardiopulmonary disease. Troponin negative. She was placed in overnight observation as repeat troponins have remained negative. On this mornings evaluation she reported resolution to chest pain symptoms. She was seen and evaluated by cardiology. A transthoracic echocardiogram revealed an ejection fraction of 65-70% without evidence of aortic valve stenosis. Case discussed with Dr. Gwenlyn Found who recommended further workup with stress test   Assessment & Plan:   Principal Problem:   Chest pain Active Problems:   Leukemia ([place])   Dyspnea   Anemia   GERD (gastroesophageal reflux disease)   Essential hypertension   1.  Chest pain. -[name] presenting with a three-day history of retrosternal chest pain associated with shortness of breath. -CT scan of lungs with IV contrast did not reveal evidence for pulmonary embolism. -Troponins were cycled and remained negative 3 sets. -Transthoracic echocardiogram did not reveal wall motion abnormalities, having preserved ejection fraction without valvular abnormalities. -Case was discussed with Dr. Gwenlyn Found of cardiology recommending further workup with stress test. -On my reassessment this afternoon she appears ill developing submandibular swelling. Initiating infectious workup, checking CT scan, starting empiric IV antibiotic therapy,  will need to reassess tomorrow if she would be stable for stress test.  2.  History of leukemia. -She reports having been diagnosed with leukemia in [place] however is currently not undergoing treatment -Lab work showing a white count of 36,000 with predominance of lymphocytes. -CT scan showed borderline enlarged retropectoral and axillary lymph nodes. -Has an increase risk of infection, now developing low-grade fever, undergoing infectious workup  3.  Submandibular swelling -On examination she appear to have bilateral submandibular swelling possibly with localized warmth with associated nodules painful to touch. She reports neck discomfort.  -She had a temp of 100.7, and on my reassessment this afternoon she appeared ill -I wonder about the possibility of an acute infectious process, possibilities could include sialoadenitis or an abscess. She has a history of leukemia that would increase her risk for infection. Labs showing elevated white count of 36,000 this morning with differential showing atypical lymphocytes.  She reports not having undergone treatment for leukemia. -Plan to further workup with CT scan of neck -Will obtain blood cultures and initiate empiric IV antibiotic therapy with Zosyn. Continue IV fluids with normal saline   DVT prophylaxis: SCDs Code Status: Full code Family Communication: I spoke to multiple family members at bedside Disposition Plan:   Consultants:   Cardiology  Procedures:     Antimicrobials:   IV Zosyn started on 01/02/2016   Subjective: On my reassessment this afternoon she reports feeling ill, denies further episodes of chest pain however reports neck pain with painful nodules  Objective: Filed Vitals:   01/02/16 0454 01/02/16 0902 01/02/16 1307 01/02/16 1607  BP: 114/56 117/57  118/60  Pulse: 76 76    Temp: 98.8 F (37.1 C) 98.3 F (36.8 C) 98.9 F (37.2 C) 100.7 F (38.2 C)  TempSrc: Oral Oral  Oral Oral  Resp: 18       Height:      Weight: 78.2 kg (172 lb 6.4 oz)     SpO2: 97% 99%      Intake/Output Summary (Last 24 hours) at 01/02/16 1612 Last data filed at 01/02/16 0449  Gross per 24 hour  Intake 616.67 ml  Output      0 ml  Net 616.67 ml   Filed Weights   01/01/16 2350 01/02/16 0454  Weight: 77.973 kg (171 lb 14.4 oz) 78.2 kg (172 lb 6.4 oz)    Examination:  General exam: Ill-appearing, no acute distress Respiratory system: Clear to auscultation. Respiratory effort normal. Cardiovascular system: S1 & S2 heard, RRR. I did not appreciate murmurs Gastrointestinal system: Abdomen is nondistended, soft and nontender. No organomegaly or masses felt. Normal bowel sounds heard. Central nervous system: Alert and oriented. No focal neurological deficits. Extremities: Symmetric 5 x 5 power. Skin: No rashes, lesions or ulcers Psychiatry: Judgement and insight appear normal. Mood & affect appropriate.     Data Reviewed: I have personally reviewed following labs and imaging studies  CBC:  Recent Labs Lab 01/01/16 1720 01/02/16 0400  WBC 48.4* 36.0*  NEUTROABS  --  4.3  HGB 10.0* 8.2*  HCT 28.9* 23.8*  MCV 84.8 83.2  PLT 189 [id number]   Basic Metabolic Panel:  Recent Labs Lab 01/01/16 1720 01/02/16 0400  NA 136 138  K 4.3 3.5  CL 105 107  CO2 23 25  GLUCOSE 121* 113*  BUN 25* 20  CREATININE 0.65 0.58  CALCIUM 9.6 8.8*   GFR: Estimated Creatinine Clearance: 60.5 mL/min (by C-G formula based on Cr of 0.58). Liver Function Tests:  Recent Labs Lab 01/02/16 0400  AST 24  ALT 21  ALKPHOS 66  BILITOT 4.0*  PROT 5.7*  ALBUMIN 3.4*   No results for input(s): LIPASE, AMYLASE in the last 168 hours. No results for input(s): AMMONIA in the last 168 hours. Coagulation Profile: No results for input(s): INR, PROTIME in the last 168 hours. Cardiac Enzymes:  Recent Labs Lab 01/01/16 2207 01/02/16 0010 01/02/16 0400  TROPONINI <0.03 <0.03 <0.03   BNP (last 3 results) No results  for input(s): PROBNP in the last 8760 hours. HbA1C: No results for input(s): HGBA1C in the last 72 hours. CBG: No results for input(s): GLUCAP in the last 168 hours. Lipid Profile:  Recent Labs  01/02/16 0400  CHOL 143  HDL 32*  LDLCALC 91  TRIG 102  CHOLHDL 4.5   Thyroid Function Tests: No results for input(s): TSH, T4TOTAL, FREET4, T3FREE, THYROIDAB in the last 72 hours. Anemia Panel: No results for input(s): VITAMINB12, FOLATE, FERRITIN, TIBC, IRON, RETICCTPCT in the last 72 hours. Sepsis Labs: No results for input(s): PROCALCITON, LATICACIDVEN in the last 168 hours.  No results found for this or any previous visit (from the past 240 hour(s)).       Radiology Studies: Dg Chest 2 View  01/01/2016  CLINICAL DATA:  Chest pain for 3 days, dizziness EXAM: CHEST  2 VIEW COMPARISON:  None. FINDINGS: Normal mediastinum and cardiac silhouette. Normal pulmonary vasculature. No evidence of effusion, infiltrate, or pneumothorax. No acute bony abnormality. IMPRESSION: No acute cardiopulmonary process. Electronically Signed   By: Suzy Bouchard M.D.   On: 01/01/2016 17:54   Ct Angio Chest Pe W/cm &/or Wo Cm  01/01/2016  CLINICAL DATA:  Chest pain and shortness of breath for 2 days, history hypertension, leukemia EXAM: CT ANGIOGRAPHY CHEST WITH CONTRAST TECHNIQUE:  Multidetector CT imaging of the chest was performed using the standard protocol during bolus administration of intravenous contrast. Multiplanar CT image reconstructions and MIPs were obtained to evaluate the vascular anatomy. CONTRAST:  80 cc Isovue 370 IV COMPARISON:  None FINDINGS: Cardiovascular: Atherosclerotic calcifications aorta without aneurysm or dissection. Pulmonary arteries patent. No evidence of pulmonary embolism. Upper normal size of cardiac chambers. Mediastinum/Nodes: Esophagus unremarkable. Upper normal sized RIGHT retropectoral nodes up to 10 mm and 9 mm diameter. Base of cervical region unremarkable. Numerous  normal, upper normal, and borderline enlarged axillary nodes bilaterally. No mediastinal or hilar adenopathy. Lungs/Pleura: Dependent atelectasis in the posterior lower lobes. Remaining lungs clear without infiltrate, pleural effusion or pneumothorax. Upper Abdomen: Question minimal splenic enlargement though spleen incompletely visualized. Remaining visualized upper abdomen normal appearance. Musculoskeletal: No acute thoracic osseous abnormalities. Review of the MIP images confirms the above findings. IMPRESSION: No evidence of pulmonary embolism. Dependent atelectasis in the posterior lower lobes bilaterally. Aortic atherosclerosis. Upper normal sized to borderline enlarged retropectoral and axillary lymph nodes. Electronically Signed   By: Lavonia Dana M.D.   On: 01/01/2016 19:36        Scheduled Meds: . sodium chloride   Intravenous Once  . enoxaparin (LOVENOX) injection  40 mg Subcutaneous QHS  . NIFEdipine  30 mg Oral Daily  . pantoprazole  40 mg Oral BID  . regadenoson  0.4 mg Intravenous Once   Continuous Infusions:       Time spent: 35 min    Kelvin Cellar, MD [facility] Pager [phone]  If 7PM-7AM, please contact night-coverage [email or web address] Password [email or web address] 01/02/2016, 4:12 PM

## 2016-01-03 ENCOUNTER — Observation Stay (HOSPITAL_COMMUNITY): Payer: Medicare Other

## 2016-01-03 DIAGNOSIS — D598 Other acquired hemolytic anemias: Secondary | ICD-10-CM

## 2016-01-03 DIAGNOSIS — K838 Other specified diseases of biliary tract: Secondary | ICD-10-CM | POA: Diagnosis not present

## 2016-01-03 DIAGNOSIS — R079 Chest pain, unspecified: Secondary | ICD-10-CM | POA: Insufficient documentation

## 2016-01-03 DIAGNOSIS — R590 Localized enlarged lymph nodes: Secondary | ICD-10-CM

## 2016-01-03 DIAGNOSIS — R161 Splenomegaly, not elsewhere classified: Secondary | ICD-10-CM | POA: Diagnosis not present

## 2016-01-03 DIAGNOSIS — D649 Anemia, unspecified: Secondary | ICD-10-CM | POA: Insufficient documentation

## 2016-01-03 DIAGNOSIS — C9110 Chronic lymphocytic leukemia of B-cell type not having achieved remission: Secondary | ICD-10-CM | POA: Diagnosis not present

## 2016-01-03 DIAGNOSIS — I10 Essential (primary) hypertension: Secondary | ICD-10-CM | POA: Diagnosis not present

## 2016-01-03 LAB — CBC WITH DIFFERENTIAL/PLATELET
Basophils Absolute: 0 10*3/uL (ref 0.0–0.1)
Basophils Relative: 0 %
EOS ABS: 0.3 10*3/uL (ref 0.0–0.7)
EOS PCT: 1 %
HCT: 22.2 % — ABNORMAL LOW (ref 36.0–46.0)
Hemoglobin: 7.9 g/dL — ABNORMAL LOW (ref 12.0–15.0)
LYMPHS ABS: 24.2 10*3/uL — AB (ref 0.7–4.0)
Lymphocytes Relative: 74 %
MCH: 29.4 pg (ref 26.0–34.0)
MCHC: 35.6 g/dL (ref 30.0–36.0)
MCV: 82.5 fL (ref 78.0–100.0)
MONO ABS: 2.9 10*3/uL — AB (ref 0.1–1.0)
Monocytes Relative: 9 %
Neutro Abs: 5.2 10*3/uL (ref 1.7–7.7)
Neutrophils Relative %: 16 %
PLATELETS: 140 10*3/uL — AB (ref 150–400)
RBC: 2.69 MIL/uL — AB (ref 3.87–5.11)
RDW: 15.1 % (ref 11.5–15.5)
WBC: 32.6 10*3/uL — AB (ref 4.0–10.5)

## 2016-01-03 LAB — COMPREHENSIVE METABOLIC PANEL
ALT: 30 U/L (ref 14–54)
AST: 27 U/L (ref 15–41)
Albumin: 3.3 g/dL — ABNORMAL LOW (ref 3.5–5.0)
Alkaline Phosphatase: 63 U/L (ref 38–126)
Anion gap: 6 (ref 5–15)
BUN: 19 mg/dL (ref 6–20)
CHLORIDE: 107 mmol/L (ref 101–111)
CO2: 25 mmol/L (ref 22–32)
Calcium: 8.7 mg/dL — ABNORMAL LOW (ref 8.9–10.3)
Creatinine, Ser: 0.74 mg/dL (ref 0.44–1.00)
Glucose, Bld: 124 mg/dL — ABNORMAL HIGH (ref 65–99)
POTASSIUM: 3.5 mmol/L (ref 3.5–5.1)
Sodium: 138 mmol/L (ref 135–145)
Total Bilirubin: 4.1 mg/dL — ABNORMAL HIGH (ref 0.3–1.2)
Total Protein: 5.7 g/dL — ABNORMAL LOW (ref 6.5–8.1)

## 2016-01-03 LAB — NM MYOCAR MULTI W/SPECT W/WALL MOTION / EF
CSEPPHR: 118 {beats}/min
Rest HR: 85 {beats}/min

## 2016-01-03 LAB — DIRECT ANTIGLOBULIN TEST (NOT AT ARMC)
DAT, COMPLEMENT: POSITIVE
DAT, IGG: NEGATIVE

## 2016-01-03 LAB — PATHOLOGIST SMEAR REVIEW

## 2016-01-03 LAB — RETICULOCYTES
RBC.: 2.69 MIL/uL — ABNORMAL LOW (ref 3.87–5.11)
Retic Ct Pct: <0.4 % — ABNORMAL LOW (ref 0.4–3.1)

## 2016-01-03 LAB — PREPARE RBC (CROSSMATCH)

## 2016-01-03 MED ORDER — TECHNETIUM TC 99M TETROFOSMIN IV KIT
30.0000 | PACK | Freq: Once | INTRAVENOUS | Status: AC | PRN
  Administered 2016-01-03: 30 via INTRAVENOUS

## 2016-01-03 MED ORDER — FOLIC ACID 1 MG PO TABS
2.0000 mg | ORAL_TABLET | Freq: Every day | ORAL | Status: DC
  Administered 2016-01-03 – 2016-01-04 (×2): 2 mg via ORAL
  Filled 2016-01-03 (×3): qty 2

## 2016-01-03 MED ORDER — FUROSEMIDE 10 MG/ML IJ SOLN
20.0000 mg | Freq: Once | INTRAMUSCULAR | Status: AC
  Administered 2016-01-03: 20 mg via INTRAVENOUS
  Filled 2016-01-03: qty 2

## 2016-01-03 MED ORDER — SULFAMETHOXAZOLE-TRIMETHOPRIM 800-160 MG PO TABS
1.0000 | ORAL_TABLET | Freq: Every day | ORAL | Status: DC
  Administered 2016-01-03 – 2016-01-04 (×2): 1 via ORAL
  Filled 2016-01-03 (×2): qty 1

## 2016-01-03 MED ORDER — ONDANSETRON HCL 4 MG/2ML IJ SOLN
INTRAMUSCULAR | Status: AC
  Administered 2016-01-03: 4 mg via INTRAVENOUS
  Filled 2016-01-03: qty 2

## 2016-01-03 MED ORDER — GADOBENATE DIMEGLUMINE 529 MG/ML IV SOLN
18.0000 mL | Freq: Once | INTRAVENOUS | Status: AC | PRN
  Administered 2016-01-03: 18 mL via INTRAVENOUS

## 2016-01-03 MED ORDER — SODIUM CHLORIDE 0.9 % IV SOLN
Freq: Once | INTRAVENOUS | Status: AC
Start: 2016-01-03 — End: 2016-01-03
  Administered 2016-01-03: 18:00:00 via INTRAVENOUS

## 2016-01-03 MED ORDER — TECHNETIUM TC 99M TETROFOSMIN IV KIT
10.0000 | PACK | Freq: Once | INTRAVENOUS | Status: AC | PRN
  Administered 2016-01-03: 10 via INTRAVENOUS

## 2016-01-03 MED ORDER — PREDNISONE 20 MG PO TABS
80.0000 mg | ORAL_TABLET | Freq: Every day | ORAL | Status: DC
  Administered 2016-01-03 – 2016-01-04 (×2): 80 mg via ORAL
  Filled 2016-01-03 (×3): qty 4

## 2016-01-03 MED ORDER — REGADENOSON 0.4 MG/5ML IV SOLN
INTRAVENOUS | Status: AC
  Administered 2016-01-03: 0.4 mg via INTRAVENOUS
  Filled 2016-01-03: qty 5

## 2016-01-03 MED ORDER — FLUCONAZOLE 100 MG PO TABS
100.0000 mg | ORAL_TABLET | Freq: Every day | ORAL | Status: DC
  Administered 2016-01-03 – 2016-01-04 (×2): 100 mg via ORAL
  Filled 2016-01-03 (×2): qty 1

## 2016-01-03 NOTE — Consult Note (Signed)
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 Chest Pain  . Dizziness  : She feels very weak.  HPI: The patient is a very charming 70 year old Hispanic female. She is originally [place] She actually lives mostly in [place] with her daughter.  She comes up to [facility] to visit a son.  I talked to her daughter by phone. She said that she has had leukemia for 8-9 years. She has never had any therapy for this. 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 This immediately only leukemia that could be indolent this long.  She says that her mother hasn't had no problems with this. She's never had any illness with this. She's never had any swollen lymph nodes with this. She's never had any weight loss. She's never had any bleeding. She's never required any intervention.  She was up in [place]. She has been feeling quite weak. She has some dizziness and some chest discomfort.  She has had scans done. She had a CT angiogram done. No embolism was noted. She had some mildly enlarged mediastinal lymph nodes. She had numerous borderline enlarged axillary lymph nodes.  On her physical exam, it resolves that she had adenopathy on her neck. She had a CT scan of the neck. She had marked adenopathy throughout her neck.  An MRI of her abdomen was done because of the elevated bilirubin. This showed splenomegaly. She had enlarged abdominal lymph nodes.  Her labs when she came in on July 5 showed a white cell count of 48,000. Hemoglobin 10 and platelet count 189,000. On July 6. Her white cell count 36,000. Hemoglobin 8.2. Platelet count 156,000. She had 12% neutrophils 80% lymphocytes. Her metabolic panel showed a bilirubin of 4. I suspect that this probably is all indirect bilirubinemia. Her BUN is 20 and creatinine 0.58. LDH was done which was mildly elevated at 354.  Echocardiogram was done which  showed an ejection fraction of 65-70%. Otherwise, she has some mild wall thickness. There is no obvious valvular abnormalities.  She's been having temperatures. Her temperatures have been between 101 -  102.  She is not eating all that much right now. There is no nausea or vomiting. She's not having any diarrhea.  Cultures have been taken and so far everything is still pending.  She still is quite fatigued. She seems to be somewhat weak.  We are asked to see her to see if we can help out with the anemia and lymphadenopathy and hopefully try to get her back home to [place].       Past Medical History  Diagnosis Date  . Hypertension   . Chronic neck and back pain   . Leukemia ([place])     No chemo  :  Past Surgical History  Procedure Laterality Date  . Cervical spine surgery    :   Current facility-administered medications:  .  0.9 %  sodium chloride infusion, , Intravenous, Once, Kelvin Cellar, MD .  acetaminophen (TYLENOL) tablet 650 mg, 650 mg, Oral, Q4H PRN, Norval Morton, MD, 650 mg at 01/03/16 0409 .  enoxaparin (LOVENOX) injection 40 mg, 40 mg, Subcutaneous, QHS, Norval Morton, MD, 40 mg at 01/02/16 2045 .  fluconazole (DIFLUCAN) tablet 100 mg, 100 mg, Oral, Daily, Volanda Napoleon, MD .  folic acid (FOLVITE) tablet 2 mg, 2 mg, Oral, Daily, Kelvin Cellar, MD .  gi cocktail (  Maalox,Lidocaine,Donnatal), 30 mL, Oral, QID PRN, Norval Morton, MD .  ipratropium-albuterol (DUONEB) 0.5-2.5 (3) MG/3ML nebulizer solution 3 mL, 3 mL, Nebulization, Q2H PRN, Norval Morton, MD .  morphine 2 MG/ML injection 2 mg, 2 mg, Intravenous, Q2H PRN, Norval Morton, MD, 2 mg at 01/02/16 2044 .  NIFEdipine (PROCARDIA-XL/ADALAT-CC/NIFEDICAL-XL) 24 hr tablet 30 mg, 30 mg, Oral, Daily, Norval Morton, MD, 30 mg at 01/03/16 0739 .  ondansetron (ZOFRAN) injection 4 mg, 4 mg, Intravenous, Q6H PRN, Norval Morton, MD, 4 mg at 01/03/16 [id number] .  pantoprazole (PROTONIX) EC tablet 40 mg, 40 mg,  Oral, BID, Norval Morton, MD, 40 mg at 01/03/16 0738 .  piperacillin-tazobactam (ZOSYN) IVPB 3.375 g, 3.375 g, Intravenous, Q8H, Kelvin Cellar, MD, 3.375 g at 01/03/16 1248 .  predniSONE (DELTASONE) tablet 80 mg, 80 mg, Oral, Daily, Volanda Napoleon, MD .  sulfamethoxazole-trimethoprim (BACTRIM DS,SEPTRA DS) 800-160 MG per tablet 1 tablet, 1 tablet, Oral, Daily, Volanda Napoleon, MD:  . sodium chloride   Intravenous Once  . enoxaparin (LOVENOX) injection  40 mg Subcutaneous QHS  . fluconazole  100 mg Oral Daily  . folic acid  2 mg Oral Daily  . NIFEdipine  30 mg Oral Daily  . pantoprazole  40 mg Oral BID  . piperacillin-tazobactam (ZOSYN)  IV  3.375 g Intravenous Q8H  . predniSONE  80 mg Oral Daily  . sulfamethoxazole-trimethoprim  1 tablet Oral Daily  :  No Known Allergies:  History reviewed. No pertinent family history.:  Social History   Social History  . Marital Status: Divorced    Spouse Name: N/A  . Number of Children: N/A  . Years of Education: N/A   Occupational History  . Not on file.   Social History Main Topics  . Smoking status: Never Smoker   . Smokeless tobacco: Not on file  . Alcohol Use: Not on file  . Drug Use: Not on file  . Sexual Activity: Not on file   Other Topics Concern  . Not on file   Social History Narrative  :  Pertinent items are noted in HPI.  Exam: Patient Vitals for the past 24 hrs:  BP Temp Temp src Pulse Resp SpO2 Weight  01/03/16 1506 (!) 102/45 mmHg 100.1 F (37.8 C) Oral 93 (!) 21 92 % -  01/03/16 0956 133/61 mmHg - - - - - -  01/03/16 0954 (!) 159/61 mmHg - - - - - -  01/03/16 0953 103/62 mmHg - - - - - -  01/03/16 0950 110/63 mmHg - - - - - -  01/03/16 0924 112/65 mmHg - - - - - -  01/03/16 0630 - 99 F (37.2 C) Oral - - - -  01/03/16 0407 (!) 93/35 mmHg (!) 100.5 F (38.1 C) Oral - - - 172 lb 2.9 oz (78.1 kg)  01/02/16 2130 - 99 F (37.2 C) - - - - -  01/02/16 2058 (!) 117/44 mmHg 100.1 F (37.8 C) Oral 91 - 95 %  -   Slightly ill-appearing Hispanic female in no obvious distress. Head exam shows obvious cervical lymphadenopathy. Lymph nodes are quite large and nontender and mobile. There is no scleral icterus. There is no oral lesions. I don't see any enlarged tonsils. Lungs are slightly decreased at the bases. Cardiac exam regular rate and rhythm with no murmurs, rubs or bruits. Abdomen is soft. Abdomen somewhat obese. She has no fluid wave. There is no obvious hepatomegaly. I cannot palpate  a spleen tip. Back exam is unremarkable. Extremities shows some trace edema in her legs. Skin exam shows no rashes, ecchymoses or petechia. Neurological exam shows no focal neurological deficits.    Recent Labs  01/02/16 0400 01/03/16 0350  WBC 36.0* 32.6*  HGB 8.2* 7.9*  HCT 23.8* 22.2*  PLT 156 140*    Recent Labs  01/02/16 0400 01/03/16 0350  NA 138 138  K 3.5 3.5  CL 107 107  CO2 25 25  GLUCOSE 113* 124*  BUN 20 19  CREATININE 0.58 0.74  CALCIUM 8.8* 8.7*    Blood smear review:  None  Pathology: None     Assessment and Plan:  Mrs.Cabrejas-Desuarez is a nice 70 year old Hispanic female. She has a history of CLL. She has not required therapy as she has been asymptomatic.  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 She has a lot of the manifestations of this area and the fevers I think reflect the transformation. The enlarging lymph nodes are also consistent with this.  She had a direct Coombs test which is positive. However, her reticulocyte count is incredibly low. As such, I think that she has a situation which she has some autoimmune hemolytic process but it cannot not a reticulocyte count because I suspect her marrow is packed with leukemia cells.  Her daughter was incredibly helpful with information. Her daughter actually lives close to the [facility] which has a very good cancer  [facility].  I think that we have to get her on prednisone right now. Again I think the fevers are not reflective of infection but I think are indicative of Richter's transformation. I will get her on 80 mg a day. I think this also may help with the anemia.  She clearly needs folic acid. I would have her on 2 mg a day of folic acid.  I would definitely transfuse her. I actually would consider 2 units of blood for her area and at first I thought maybe one unit but given her low reticulocyte count, I don't think that she will be able to mount a response for a while.  Hopefully, we can get her feeling better so she can get down to [place]. Her daughter would like to get her home.  Ultimately, a lymph node biopsy will help. This will be done closer to home if she does go home.  This is a very fascinating case. 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 I told her daughter this. I think she understands pretty well. Her daughter was incredibly eloquent and quite astute when I was talking to her.  I spent about an hour with the patient and her grandson. She seems to be incredibly nice.  Pete E.  Isaiah 41:10

## 2016-01-03 NOTE — Progress Notes (Signed)
    Stress test was normal. Patient was very relieved. Cardiology will sign off.   Angelena Form PA-C  [facility]

## 2016-01-03 NOTE — Progress Notes (Signed)
Patient Name: Bethany Travis Date of Encounter: 01/03/2016     Principal Problem:   Chest pain Active Problems:   Leukemia ([place])   Dyspnea   Anemia   GERD (gastroesophageal reflux disease)   Essential hypertension    SUBJECTIVE  No CP or SOB. Seen in nuc med for stress test.   CURRENT MEDS . sodium chloride   Intravenous Once  . enoxaparin (LOVENOX) injection  40 mg Subcutaneous QHS  . NIFEdipine  30 mg Oral Daily  . pantoprazole  40 mg Oral BID  . piperacillin-tazobactam (ZOSYN)  IV  3.375 g Intravenous Q8H    OBJECTIVE  Filed Vitals:   01/02/16 2130 01/03/16 0407 01/03/16 0630 01/03/16 0924  BP:  93/35  112/65  Pulse:      Temp: 99 F (37.2 C) 100.5 F (38.1 C) 99 F (37.2 C)   TempSrc:  Oral Oral   Resp:      Height:      Weight:  172 lb 2.9 oz (78.1 kg)    SpO2:        Intake/Output Summary (Last 24 hours) at 01/03/16 0940 Last data filed at 01/03/16 0900  Gross per 24 hour  Intake      0 ml  Output      0 ml  Net      0 ml   Filed Weights   01/01/16 2350 01/02/16 0454 01/03/16 0407  Weight: 171 lb 14.4 oz (77.973 kg) 172 lb 6.4 oz (78.2 kg) 172 lb 2.9 oz (78.1 kg)    PHYSICAL EXAM  General: Pleasant, NAD. Neuro: Alert and oriented X 3. Moves all extremities spontaneously. Psych: Normal affect. HEENT:  Normal  Neck: Supple without bruits or JVD. Lungs:  Resp regular and unlabored, CTA. Heart: RRR no s3, s4, or murmurs. Abdomen: Soft, non-tender, non-distended, BS + x 4.  Extremities: No clubbing, cyanosis or edema. DP/PT/Radials 2+ and equal bilaterally.  Accessory Clinical Findings  CBC  Recent Labs  01/02/16 0400 01/03/16 0350  WBC 36.0* 32.6*  NEUTROABS 4.3 5.2  HGB 8.2* 7.9*  HCT 23.8* 22.2*  MCV 83.2 82.5  PLT 156 [id number]*   Basic Metabolic Panel  Recent Labs  01/02/16 0400 01/03/16 0350  NA 138 138  K 3.5 3.5  CL 107 107  CO2 25 25  GLUCOSE 113* 124*  BUN 20 19  CREATININE 0.58 0.74  CALCIUM 8.8* 8.7*     Liver Function Tests  Recent Labs  01/02/16 0400 01/03/16 0350  AST 24 27  ALT 21 30  ALKPHOS 66 63  BILITOT 4.0* 4.1*  PROT 5.7* 5.7*  ALBUMIN 3.4* 3.3*   No results for input(s): LIPASE, AMYLASE in the last 72 hours. Cardiac Enzymes  Recent Labs  01/01/16 2207 01/02/16 0010 01/02/16 0400  TROPONINI <0.03 <0.03 <0.03  Fasting Lipid Panel  Recent Labs  01/02/16 0400  CHOL 143  HDL 32*  LDLCALC 91  TRIG 102  CHOLHDL 4.5   Thyroid Function Tests No results for input(s): TSH, T4TOTAL, T3FREE, THYROIDAB in the last 72 hours.  Invalid input(s): FREET3  TELE  NSR  Radiology/Studies  Dg Chest 2 View  01/01/2016  CLINICAL DATA:  Chest pain for 3 days, dizziness EXAM: CHEST  2 VIEW COMPARISON:  None. FINDINGS: Normal mediastinum and cardiac silhouette. Normal pulmonary vasculature. No evidence of effusion, infiltrate, or pneumothorax. No acute bony abnormality. IMPRESSION: No acute cardiopulmonary process. Electronically Signed   By: Suzy Bouchard M.D.   On: 01/01/2016  17:54   Ct Soft Tissue Neck W Contrast  01/02/2016  CLINICAL DATA:  70 year old female with history of leukemia and neck swelling. Initial encounter. EXAM: CT NECK WITH CONTRAST TECHNIQUE: Multidetector CT imaging of the neck was performed using the standard protocol following the bolus administration of intravenous contrast. CONTRAST:  1 ISOVUE-300 IOPAMIDOL (ISOVUE-300) INJECTION 61% COMPARISON:  None. FINDINGS: Pharynx and larynx: Posterior superior pharyngeal enlargement with 2.4 x 2 x 2.2 cm low-density component. Although large Thornwaldt cyst is a possibility, necrotic adenoidal tissue/mass or abscess are also considerations. No erosion of the adjacent clivus. Symmetric mild prominence of the palatine tonsils. Salivary glands: Adenopathy within the parotid glands greater on the left. Thyroid: No dominant mass. Lymph nodes: Marked neck adenopathy involving all stations. Adenopathy occipital region.  Adenopathy axilla and increase number superior mediastinal/ supraclavicular lymph nodes. Vascular: Aortic atherosclerosis. Tortuous carotid arteries. Carotid bifurcation calcifications without hemodynamically significant stenosis. No thrombosis of the internal jugular veins which are slightly narrowed by adjacent adenopathy. Limited intracranial: No enhancing lesion. Visualized orbits: Mild exophthalmos. Mastoids and visualized paranasal sinuses: Negative. Skeleton: Cervical spondylotic changes. Post extensive decompression C2 through C6. Cord may be tethered posteriorly but incompletely assessed. Upper chest: No dominant mass.  Minimal atelectatic changes. IMPRESSION: Marked neck adenopathy involving all stations. Adenopathy occipital region. Adenopathy axilla and increase number superior mediastinal/ supraclavicular lymph nodes. Findings highly suspicious for lymphoma. Posterior superior pharyngeal enlargement with 2.4 x 2 x 2.2 cm low-density component. Although large Thornwaldt cyst is a possibility, necrotic adenoidal tissue/mass or abscess are also considerations. No erosion of the adjacent clivus. Symmetric mild prominence of the palatine tonsils. Postsurgical changes cervical spine as noted above. Electronically Signed   By: Genia Del M.D.   On: 01/02/2016 20:07   Ct Angio Chest Pe W/cm &/or Wo Cm  01/01/2016  CLINICAL DATA:  Chest pain and shortness of breath for 2 days, history hypertension, leukemia EXAM: CT ANGIOGRAPHY CHEST WITH CONTRAST TECHNIQUE: Multidetector CT imaging of the chest was performed using the standard protocol during bolus administration of intravenous contrast. Multiplanar CT image reconstructions and MIPs were obtained to evaluate the vascular anatomy. CONTRAST:  80 cc Isovue 370 IV COMPARISON:  None FINDINGS: Cardiovascular: Atherosclerotic calcifications aorta without aneurysm or dissection. Pulmonary arteries patent. No evidence of pulmonary embolism. Upper normal size of  cardiac chambers. Mediastinum/Nodes: Esophagus unremarkable. Upper normal sized RIGHT retropectoral nodes up to 10 mm and 9 mm diameter. Base of cervical region unremarkable. Numerous normal, upper normal, and borderline enlarged axillary nodes bilaterally. No mediastinal or hilar adenopathy. Lungs/Pleura: Dependent atelectasis in the posterior lower lobes. Remaining lungs clear without infiltrate, pleural effusion or pneumothorax. Upper Abdomen: Question minimal splenic enlargement though spleen incompletely visualized. Remaining visualized upper abdomen normal appearance. Musculoskeletal: No acute thoracic osseous abnormalities. Review of the MIP images confirms the above findings. IMPRESSION: No evidence of pulmonary embolism. Dependent atelectasis in the posterior lower lobes bilaterally. Aortic atherosclerosis. Upper normal sized to borderline enlarged retropectoral and axillary lymph nodes. Electronically Signed   By: Lavonia Dana M.D.   On: 01/01/2016 19:36   US Abdomen Limited Ruq  01/02/2016  CLINICAL DATA:  Hyperbilirubinemia, history hypertension, leukemia EXAM: US ABDOMEN LIMITED - RIGHT UPPER QUADRANT COMPARISON:  None FINDINGS: Gallbladder: Remote cholecystectomy by history 30+ years ago. Common bile duct: Diameter: 10 mm diameter, dilated.  Distal CBD not visualized. Liver: Normal echogenicity. No definite mass lesion or nodularity. No significant intrahepatic biliary dilatation. Hepatopetal portal venous flow. Grossly smooth hepatic contours. No RIGHT upper  quadrant free fluid. IMPRESSION: Extrahepatic biliary dilatation with CBD 10 mm diameter, question physiologic dilatation due to remote cholecystectomy versus distal CBD obstruction ; recommend correlation with LFTs. Consider follow-up MRCP imaging to exclude distal CBD obstruction. Electronically Signed   By: Lavonia Dana M.D.   On: 01/02/2016 18:43    2D ECHO: 01/02/2016 LV EF: 65% - 70% Study Conclusions - Left ventricle: The cavity  size was normal. Wall thickness was  increased in a pattern of moderate LVH. Systolic function was  vigorous. The estimated ejection fraction was in the range of 65%  to 70%. Wall motion was normal; there were no regional wall  motion abnormalities. Left ventricular diastolic function  parameters were normal. - Left atrium: The atrium was mildly dilated. - Atrial septum: No defect or patent foramen ovale was identified.    ASSESSMENT AND PLAN Jinx Carrozza is a 70 y.o. female with a history of AS, HTN, chronic back & pain pain and hx of untreated leukemia who presented to [facility] on 01/01/16 for evaluation of dizziness, chest pain/palpitaitons and shortness of breath.  Chest pain/SOB: felt to be atypical. May be 2/2 worsening anemia Ruled out for MI. CTA negative for PE.2D ECHO with normal EF and no WMA. Currently chest pain free. Seen in nuc med today for lexiscan myoview.   Hx of AS: 2D ECHO yesterday with trileaflet valve with no AS or AR.  Palpitations: History for many years. Her palpitation usually occurs once every week and lasts for about a minute or 2. Telemetry shows sinus rhythm at a rate mostly in 80s, however intermittently goes to 110. No arrhythmia noted.   HTN: upon presentation blood pressure was 147/74. Improved now. Continue nifedipine.   Leukemia (HCC)/ Anemia: per primary  Submandibular swelling: with fevers and leukocytosis. CT neck with " Marked neck adenopathy involving all stations. Adenopathy occipital region. Adenopathy axilla and increase number superior mediastinal/supraclavicular lymph nodes. Findings highly suspicious for Lymphoma. Posterior superior pharyngeal enlargement with 2.4 x 2 x 2.2 cm low-density component. Although large Thornwaldt cyst is a possibility, necrotic adenoidal tissue/mass or abscess are also considerations"  Signed, Angelena Form PA-C  Pager [phone] Agree with note by Nell Range PA-C  Nl 2D. No AS. Had  Lexiscan this AM. Results pending. W/U for lymphadenopathy and GB on going.   Lorretta Harp, M.D., [place], [facility], [place] [address]. [place], [place]  [id number]  [phone] 01/03/2016 10:36 AM

## 2016-01-03 NOTE — Progress Notes (Addendum)
PROGRESS NOTE    Bethany Travis  [id number] DOB: 09-09-1945 DOA: 01/01/2016 PCP: Pcp Not In System   Brief Narrative:  Bethany Travis is a 70 year old female with a past medical history of hypertension, leukemia, originally from [place] visiting family members in town when she presented with complaints of retrosternal chest pain associated with shortness of breath dizziness and palpitations. She recently drove in from [place], [place]. She reported having history of aortic valve disease and seen a cardiologist in [place] as well as in the [place] Chest x-ray did not reveal acute cardiopulmonary disease. Troponin negative. She was placed in overnight observation as repeat troponins have remained negative. On this mornings evaluation she reported resolution to chest pain symptoms. She was seen and evaluated by cardiology. A transthoracic echocardiogram revealed an ejection fraction of 65-70% without evidence of aortic valve stenosis. Case discussed with Dr. Gwenlyn Found who recommended further workup with stress test   Assessment & Plan:   Principal Problem:   Chest pain Active Problems:   Leukemia ([place])   Dyspnea   Anemia   GERD (gastroesophageal reflux disease)   Essential hypertension   1.  Chest pain. -Bethany Travis presenting with a three-day history of retrosternal chest pain associated with shortness of breath. -CT scan of lungs with IV contrast did not reveal evidence for pulmonary embolism. -Troponins were cycled and remained negative 3 sets. -Transthoracic echocardiogram did not reveal wall motion abnormalities, having preserved ejection fraction without valvular abnormalities. -Stress test performed on 01/03/2016 did not reveal evidence of reversible ischemia  2.  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 -She reports having been diagnosed with leukemia in [place] however is currently not undergoing treatment -Lab work showing a white count  of 36,000 with predominance of lymphocytes. -On 01/02/2016 she developed low-grade fever was started on empiric IV antibiotic therapy with Zosyn. -This was further worked up with a soft tissue CT scan of the neck performed on 01/02/2016 that revealed innumerable neck adenopathy consistent with lymphoma. Also noted was superior pharyngeal enlargement with 2. 2 x 2 by 2.2 cm low-density components that could represent necrotic adenoidal tissue/mass versus abscess. These findings were discussed with ENT who felt this likely represented adenoidal tissue. He did not recommend needle aspiration for the time being. -When medically stable for discharge she plans to fly back down to [facility] and see her oncologist to discuss further treatment. -With regard to low-grade fever of plan to treat with IV Zosyn for the next 24 hours and follow-up on culture data. Reassess in a.m.  3.  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 Her LDH was 346. -I discussed this with Dr. Marin Olp of medical oncology who felt this could be related to hemolytic anemia and recommended checking a Coombs test along with reticulocyte count and haptoglobin. He also recommended transfusing 1 unit of packed red blood cells and starting folate 2 mg by mouth daily -If Coombs test comes back positive she may need to be started on steroids.   4.  Hyperbilirubinemia -Lab work showing elevated bilirubin of 4.1. -This had been further worked up with right upper quadrant ultrasound that showed common bile duct measuring 10 mm in diameter. It was unclear if this was related to remote cholecystectomy or to distal common bile duct obstruction, radiology recommended MRCP to exclude obstruction. -MRCP performed on 01/03/2016 did not reveal choledocholithiasis or obstructing mass. Pancreas had normal appearance and pancreatic duct appearing normal as well. -Right upper quadrant  ultrasound had shown normal echogenicity of  liver without definite mass lesions or nodularity. -Could be related to hemolytic anemia  5.  Hypertension -Stable, continue nifedipine at 30 mg by mouth daily  DVT prophylaxis: SCDs Code Status: Full code Family Communication: I spoke to multiple family members at bedside Disposition Plan:   Consultants:   Cardiology  Procedures:   Stress test performed on 01/03/2016  Antimicrobials:   IV Zosyn started on 01/02/2016   Subjective: She states she will better although continues to have some neck pain  Objective: Filed Vitals:   01/03/16 0953 01/03/16 0954 01/03/16 0956 01/03/16 1506  BP: 103/62 159/61 133/61 102/45  Pulse:    93  Temp:    100.1 F (37.8 C)  TempSrc:    Oral  Resp:    21  Height:      Weight:      SpO2:    92%    Intake/Output Summary (Last 24 hours) at 01/03/16 1544 Last data filed at 01/03/16 0900  Gross per 24 hour  Intake      0 ml  Output      0 ml  Net      0 ml   Filed Weights   01/01/16 2350 01/02/16 0454 01/03/16 0407  Weight: 77.973 kg (171 lb 14.4 oz) 78.2 kg (172 lb 6.4 oz) 78.1 kg (172 lb 2.9 oz)    Examination:  General exam: She appears mildly icteric Respiratory system: Clear to auscultation. Respiratory effort normal. Cardiovascular system: S1 & S2 heard, RRR. I did not appreciate murmurs Gastrointestinal system: Abdomen is nondistended, soft and nontender. No organomegaly or masses felt. Normal bowel sounds heard. Central nervous system: Alert and oriented. No focal neurological deficits. Extremities: Symmetric 5 x 5 power. Skin: No rashes, lesions or ulcers Psychiatry: Judgement and insight appear normal. Mood & affect appropriate.     Data Reviewed: I have personally reviewed following labs and imaging studies  CBC:  Recent Labs Lab 01/01/16 1720 01/02/16 0400 01/03/16 0350  WBC 48.4* 36.0* 32.6*  NEUTROABS  --  4.3 5.2  HGB 10.0* 8.2* 7.9*  HCT 28.9* 23.8* 22.2*  MCV 84.8 83.2 82.5  PLT 189 156 140*    Basic Metabolic Panel:  Recent Labs Lab 01/01/16 1720 01/02/16 0400 01/03/16 0350  NA 136 138 138  K 4.3 3.5 3.5  CL 105 107 107  CO2 23 25 25   GLUCOSE 121* 113* 124*  BUN 25* 20 19  CREATININE 0.65 0.58 0.74  CALCIUM 9.6 8.8* 8.7*   GFR: Estimated Creatinine Clearance: 60.4 mL/min (by C-G formula based on Cr of 0.74). Liver Function Tests:  Recent Labs Lab 01/02/16 0400 01/03/16 0350  AST 24 27  ALT 21 30  ALKPHOS 66 63  BILITOT 4.0* 4.1*  PROT 5.7* 5.7*  ALBUMIN 3.4* 3.3*   No results for input(s): LIPASE, AMYLASE in the last 168 hours. No results for input(s): AMMONIA in the last 168 hours. Coagulation Profile: No results for input(s): INR, PROTIME in the last 168 hours. Cardiac Enzymes:  Recent Labs Lab 01/01/16 2207 01/02/16 0010 01/02/16 0400  TROPONINI <0.03 <0.03 <0.03   BNP (last 3 results) No results for input(s): PROBNP in the last 8760 hours. HbA1C: No results for input(s): HGBA1C in the last 72 hours. CBG: No results for input(s): GLUCAP in the last 168 hours. Lipid Profile:  Recent Labs  01/02/16 0400  CHOL 143  HDL 32*  LDLCALC 91  TRIG 102  CHOLHDL 4.5  Thyroid Function Tests: No results for input(s): TSH, T4TOTAL, FREET4, T3FREE, THYROIDAB in the last 72 hours. Anemia Panel: No results for input(s): VITAMINB12, FOLATE, FERRITIN, TIBC, IRON, RETICCTPCT in the last 72 hours. Sepsis Labs: No results for input(s): PROCALCITON, LATICACIDVEN in the last 168 hours.  No results found for this or any previous visit (from the past 240 hour(s)).       Radiology Studies: Dg Chest 2 View  01/01/2016  CLINICAL DATA:  Chest pain for 3 days, dizziness EXAM: CHEST  2 VIEW COMPARISON:  None. FINDINGS: Normal mediastinum and cardiac silhouette. Normal pulmonary vasculature. No evidence of effusion, infiltrate, or pneumothorax. No acute bony abnormality. IMPRESSION: No acute cardiopulmonary process. Electronically Signed   By: Suzy Bouchard M.D.   On: 01/01/2016 17:54   Ct Soft Tissue Neck W Contrast  01/02/2016  CLINICAL DATA:  70 year old female with history of leukemia and neck swelling. Initial encounter. EXAM: CT NECK WITH CONTRAST TECHNIQUE: Multidetector CT imaging of the neck was performed using the standard protocol following the bolus administration of intravenous contrast. CONTRAST:  1 ISOVUE-300 IOPAMIDOL (ISOVUE-300) INJECTION 61% COMPARISON:  None. FINDINGS: Pharynx and larynx: Posterior superior pharyngeal enlargement with 2.4 x 2 x 2.2 cm low-density component. Although large Thornwaldt cyst is a possibility, necrotic adenoidal tissue/mass or abscess are also considerations. No erosion of the adjacent clivus. Symmetric mild prominence of the palatine tonsils. Salivary glands: Adenopathy within the parotid glands greater on the left. Thyroid: No dominant mass. Lymph nodes: Marked neck adenopathy involving all stations. Adenopathy occipital region. Adenopathy axilla and increase number superior mediastinal/ supraclavicular lymph nodes. Vascular: Aortic atherosclerosis. Tortuous carotid arteries. Carotid bifurcation calcifications without hemodynamically significant stenosis. No thrombosis of the internal jugular veins which are slightly narrowed by adjacent adenopathy. Limited intracranial: No enhancing lesion. Visualized orbits: Mild exophthalmos. Mastoids and visualized paranasal sinuses: Negative. Skeleton: Cervical spondylotic changes. Post extensive decompression C2 through C6. Cord may be tethered posteriorly but incompletely assessed. Upper chest: No dominant mass.  Minimal atelectatic changes. IMPRESSION: Marked neck adenopathy involving all stations. Adenopathy occipital region. Adenopathy axilla and increase number superior mediastinal/ supraclavicular lymph nodes. Findings highly suspicious for lymphoma. Posterior superior pharyngeal enlargement with 2.4 x 2 x 2.2 cm low-density component. Although large Thornwaldt  cyst is a possibility, necrotic adenoidal tissue/mass or abscess are also considerations. No erosion of the adjacent clivus. Symmetric mild prominence of the palatine tonsils. Postsurgical changes cervical spine as noted above. Electronically Signed   By: Genia Del M.D.   On: 01/02/2016 20:07   Ct Angio Chest Pe W/cm &/or Wo Cm  01/01/2016  CLINICAL DATA:  Chest pain and shortness of breath for 2 days, history hypertension, leukemia EXAM: CT ANGIOGRAPHY CHEST WITH CONTRAST TECHNIQUE: Multidetector CT imaging of the chest was performed using the standard protocol during bolus administration of intravenous contrast. Multiplanar CT image reconstructions and MIPs were obtained to evaluate the vascular anatomy. CONTRAST:  80 cc Isovue 370 IV COMPARISON:  None FINDINGS: Cardiovascular: Atherosclerotic calcifications aorta without aneurysm or dissection. Pulmonary arteries patent. No evidence of pulmonary embolism. Upper normal size of cardiac chambers. Mediastinum/Nodes: Esophagus unremarkable. Upper normal sized RIGHT retropectoral nodes up to 10 mm and 9 mm diameter. Base of cervical region unremarkable. Numerous normal, upper normal, and borderline enlarged axillary nodes bilaterally. No mediastinal or hilar adenopathy. Lungs/Pleura: Dependent atelectasis in the posterior lower lobes. Remaining lungs clear without infiltrate, pleural effusion or pneumothorax. Upper Abdomen: Question minimal splenic enlargement though spleen incompletely visualized. Remaining visualized upper abdomen  normal appearance. Musculoskeletal: No acute thoracic osseous abnormalities. Review of the MIP images confirms the above findings. IMPRESSION: No evidence of pulmonary embolism. Dependent atelectasis in the posterior lower lobes bilaterally. Aortic atherosclerosis. Upper normal sized to borderline enlarged retropectoral and axillary lymph nodes. Electronically Signed   By: Lavonia Dana M.D.   On: 01/01/2016 19:36   Mr 3d Recon At  Scanner  01/03/2016  CLINICAL DATA:  Patient with elevated Burnadette Peter. EXAM: MRI ABDOMEN WITHOUT AND WITH CONTRAST (INCLUDING MRCP) TECHNIQUE: Multiplanar multisequence MR imaging of the abdomen was performed both before and after the administration of intravenous contrast. Heavily T2-weighted images of the biliary and pancreatic ducts were obtained, and three-dimensional MRCP images were rendered by post processing. CONTRAST:  23mL MULTIHANCE GADOBENATE DIMEGLUMINE 529 MG/ML IV SOLN COMPARISON:  None. FINDINGS: Lower chest: Enlarged axillary lymph nodes are identified. No pleural or pericardial effusion identified. Hepatobiliary: No enhancing liver abnormalities identified. Previous cholecystectomy. No intrahepatic bile duct dilatation. The common bile duct measures 1 cm in maximum diameter. No choledocholithiasis identified. Pancreas: Normal appearance of the pancreas. The pancreatic duct appears normal. Spleen: The spleen measures 11 cm in length. No focal splenic abnormality identified. Adrenals/Urinary Tract: Normal appearance of the adrenal glands. The left kidney appears normal. Small cyst in the right kidney measures 5 mm, image 95 of series 1501. Stomach/Bowel: The stomach appears normal. The upper abdominal bowel loops have a normal course and caliber. No bowel obstruction. Vascular/Lymphatic: Normal appearance of the abdominal aorta. Peripancreatic lymph node is enlarged measuring 1.3 cm. No retroperitoneal adenopathy. Other:  No free fluid or fluid collections. Musculoskeletal:  No suspicious bone lesions identified. IMPRESSION: 1. Increase caliber of the common bile duct status post cholecystectomy. No intrahepatic bile duct dilatation noted. The no choledocholithiasis or obstructing mass noted. 2. Enlarged bilateral axillary lymph nodes. There is also an enlarged peripancreatic node within the upper abdomen. Electronically Signed   By: Kerby Moors M.D.   On: 01/03/2016 13:18   Nm Myocar Multi  W/spect W/wall Motion / Ef  01/03/2016  CLINICAL DATA:  Chest pain EXAM: MYOCARDIAL IMAGING WITH SPECT (REST AND PHARMACOLOGIC-STRESS) GATED LEFT VENTRICULAR WALL MOTION STUDY LEFT VENTRICULAR EJECTION FRACTION TECHNIQUE: Standard myocardial SPECT imaging was performed after resting intravenous injection of 10 mCi Tc-52m tetrofosmin. Subsequently, intravenous infusion of Lexiscan was performed under the supervision of the Cardiology staff. At peak effect of the drug, 30 mCi Tc-78m tetrofosmin was injected intravenously and standard myocardial SPECT imaging was performed. Quantitative gated imaging was also performed to evaluate left ventricular wall motion, and estimate left ventricular ejection fraction. COMPARISON:  None. FINDINGS: Perfusion: There is a moderate-sized fixed perfusion defect at the apex which extends into the lateral wall. No definite stress-induced ischemia. Wall Motion: Normal left ventricular wall motion. No left ventricular dilation. Left Ventricular Ejection Fraction: 89 % End diastolic volume 60 ml End systolic volume 7 ml IMPRESSION: 1. There is a fixed perfusion defect at the apex but no definite stress-induced ischemia. 2. Normal left ventricular wall motion. 3. Left ventricular ejection fraction 89% 4. Non invasive risk stratification*: Low risk *2012 Appropriate Use Criteria for Coronary Revascularization Focused Update: J Am Coll Cardiol. N6492421. [email or web address]://content.[email or web address].aspx?[email or web address] Electronically Signed   By: Marybelle Killings M.D.   On: 01/03/2016 13:17   Mr Abd W/wo Cm/mrcp  01/03/2016  CLINICAL DATA:  Patient with elevated Burnadette Peter. EXAM: MRI ABDOMEN WITHOUT AND WITH CONTRAST (INCLUDING MRCP) TECHNIQUE: Multiplanar multisequence MR imaging of the abdomen was performed both before and after the  administration of intravenous contrast. Heavily T2-weighted images of the biliary and pancreatic ducts were obtained, and three-dimensional MRCP images were  rendered by post processing. CONTRAST:  89mL MULTIHANCE GADOBENATE DIMEGLUMINE 529 MG/ML IV SOLN COMPARISON:  None. FINDINGS: Lower chest: Enlarged axillary lymph nodes are identified. No pleural or pericardial effusion identified. Hepatobiliary: No enhancing liver abnormalities identified. Previous cholecystectomy. No intrahepatic bile duct dilatation. The common bile duct measures 1 cm in maximum diameter. No choledocholithiasis identified. Pancreas: Normal appearance of the pancreas. The pancreatic duct appears normal. Spleen: The spleen measures 11 cm in length. No focal splenic abnormality identified. Adrenals/Urinary Tract: Normal appearance of the adrenal glands. The left kidney appears normal. Small cyst in the right kidney measures 5 mm, image 95 of series 1501. Stomach/Bowel: The stomach appears normal. The upper abdominal bowel loops have a normal course and caliber. No bowel obstruction. Vascular/Lymphatic: Normal appearance of the abdominal aorta. Peripancreatic lymph node is enlarged measuring 1.3 cm. No retroperitoneal adenopathy. Other:  No free fluid or fluid collections. Musculoskeletal:  No suspicious bone lesions identified. IMPRESSION: 1. Increase caliber of the common bile duct status post cholecystectomy. No intrahepatic bile duct dilatation noted. The no choledocholithiasis or obstructing mass noted. 2. Enlarged bilateral axillary lymph nodes. There is also an enlarged peripancreatic node within the upper abdomen. Electronically Signed   By: Kerby Moors M.D.   On: 01/03/2016 13:18   US Abdomen Limited Ruq  01/02/2016  CLINICAL DATA:  Hyperbilirubinemia, history hypertension, leukemia EXAM: US ABDOMEN LIMITED - RIGHT UPPER QUADRANT COMPARISON:  None FINDINGS: Gallbladder: Remote cholecystectomy by history 30+ years ago. Common bile duct: Diameter: 10 mm diameter, dilated.  Distal CBD not visualized. Liver: Normal echogenicity. No definite mass lesion or nodularity. No significant  intrahepatic biliary dilatation. Hepatopetal portal venous flow. Grossly smooth hepatic contours. No RIGHT upper quadrant free fluid. IMPRESSION: Extrahepatic biliary dilatation with CBD 10 mm diameter, question physiologic dilatation due to remote cholecystectomy versus distal CBD obstruction ; recommend correlation with LFTs. Consider follow-up MRCP imaging to exclude distal CBD obstruction. Electronically Signed   By: Lavonia Dana M.D.   On: 01/02/2016 18:43        Scheduled Meds: . sodium chloride   Intravenous Once  . enoxaparin (LOVENOX) injection  40 mg Subcutaneous QHS  . NIFEdipine  30 mg Oral Daily  . pantoprazole  40 mg Oral BID  . piperacillin-tazobactam (ZOSYN)  IV  3.375 g Intravenous Q8H   Continuous Infusions:       Time spent: 35 min    Kelvin Cellar, MD [facility] Pager [phone]  If 7PM-7AM, please contact night-coverage [email or web address] Password [email or web address] 01/03/2016, 3:44 PM

## 2016-01-04 DIAGNOSIS — D594 Other nonautoimmune hemolytic anemias: Secondary | ICD-10-CM | POA: Diagnosis not present

## 2016-01-04 DIAGNOSIS — R079 Chest pain, unspecified: Secondary | ICD-10-CM | POA: Diagnosis not present

## 2016-01-04 LAB — BASIC METABOLIC PANEL
ANION GAP: 8 (ref 5–15)
BUN: 25 mg/dL — ABNORMAL HIGH (ref 6–20)
CHLORIDE: 103 mmol/L (ref 101–111)
CO2: 24 mmol/L (ref 22–32)
CREATININE: 0.73 mg/dL (ref 0.44–1.00)
Calcium: 8.8 mg/dL — ABNORMAL LOW (ref 8.9–10.3)
GFR calc non Af Amer: >60 mL/min (ref >60)
Glucose, Bld: 158 mg/dL — ABNORMAL HIGH (ref 65–99)
POTASSIUM: 3.5 mmol/L (ref 3.5–5.1)
SODIUM: 135 mmol/L (ref 135–145)

## 2016-01-04 LAB — CBC
HCT: 29.5 % — ABNORMAL LOW (ref 36.0–46.0)
HEMOGLOBIN: 10 g/dL — AB (ref 12.0–15.0)
MCH: 28 pg (ref 26.0–34.0)
MCHC: 34.2 g/dL (ref 30.0–36.0)
MCV: 81.7 fL (ref 78.0–100.0)
PLATELETS: 144 10*3/uL — AB (ref 150–400)
RBC: 3.61 MIL/uL — AB (ref 3.87–5.11)
RDW: 15 % (ref 11.5–15.5)
WBC: 48.5 10*3/uL — AB (ref 4.0–10.5)

## 2016-01-04 LAB — HAPTOGLOBIN: Haptoglobin: <10 mg/dL — ABNORMAL LOW (ref 34–200)

## 2016-01-04 MED ORDER — FLUCONAZOLE 100 MG PO TABS: 100.0000 mg | ORAL_TABLET | Freq: Every day | ORAL | Status: DC

## 2016-01-04 MED ORDER — SULFAMETHOXAZOLE-TRIMETHOPRIM 800-160 MG PO TABS: 1.0000 | ORAL_TABLET | Freq: Every day | ORAL | Status: DC

## 2016-01-04 MED ORDER — PREDNISONE 10 MG (21) PO TBPK: ORAL_TABLET | ORAL | Status: DC

## 2016-01-04 MED ORDER — FOLIC ACID 1 MG PO TABS: 2.0000 mg | ORAL_TABLET | Freq: Every day | ORAL | Status: DC

## 2016-01-04 MED ORDER — OXYCODONE HCL 5 MG PO TABS: 5.0000 mg | ORAL_TABLET | ORAL | Status: DC | PRN

## 2016-01-04 NOTE — Discharge Instructions (Signed)
Dolor de pecho inespecfico (Nonspecific Chest Pain) Suele ser difcil encontrar la causa del dolor de [place]. Siempre existe una posibilidad de que el dolor est relacionado con algo grave, como un infarto de miocardio o un cogulo sanguneo en los pulmones. Hay muchas enfermedades que no son potencialmente mortales que pueden causar dolor de [place]. Es importante que concurra a las visitas de control con el mdico.  CUIDADOS EN EL HOGAR  Si le recetaron antibiticos, asegrese de terminarlos, incluso si comienza a Sports administrator.  Evite las [employer] causen dolor de [place].  No consuma ningn producto que contenga tabaco, lo que incluye cigarrillos, tabaco de Higher education careers adviser o Psychologist, sport and exercise. Si necesita ayuda para dejar de fumar, consulte al mdico.  No beba alcohol.  Tome los medicamentos solamente como se lo haya indicado el mdico.  Concurra a todas las visitas de control como se lo haya indicado el mdico. Esto es importante. Esto incluye otros estudios si el dolor de pecho no desaparece.  El mdico puede indicarle que mantenga la cabeza levantada (elevada) mientras duerme.  Haga cambios en su estilo de vida segn las indicaciones del mdico. Estos pueden incluir lo siguiente:  Psychologist, prison and probation services actividad fsica con regularidad. Pdale al mdico que le sugiera algunas actividades que sean seguras para usted.  Consumir una dieta cardiosaludable. El mdico o un especialista en alimentacin (nutricionista) pueden ayudarlo a que haga elecciones saludables.  Mantener un peso saludable.  Controlar la diabetes, si es necesario.  Reducir las situaciones de estrs. SOLICITE AYUDA SI:  El dolor de pecho no desaparece, incluso despus del tratamiento.  Tiene una erupcin cutnea con ampollas en el pecho.  Tiene fiebre. SOLICITE AYUDA DE INMEDIATO SI:  El dolor en el pecho es ms intenso.  La tos empeora, o expectora sangre.  Siente un dolor intenso en el vientre  (abdomen).  Se siente muy dbil.  Pierde el conocimiento (se desmaya).  Tiene escalofros.  Tiene una molestia repentina e inexplicable en el pecho.  Tiene molestias repentinas e [employer], la espalda, el cuello o la [place].  Le falta el aire en cualquier momento.  Comienza a sudar de [place] repentina o la piel se le humedece.  Siente nuseas.  Vomita.  Se siente repentinamente mareado o se desmaya.  Siente que el corazn comienza a latir rpidamente o que se saltea latidos. Estos sntomas pueden Sales executive. No espere hasta que los sntomas desaparezcan. Solicite atencin mdica de inmediato. Comunquese con el servicio de emergencias de su localidad (911 en los [place]). No conduzca por sus propios medios [employer].   Esta informacin no tiene Marine scientist el consejo del mdico. Asegrese de hacerle al mdico cualquier pregunta que tenga.   Document Released: 09/11/2008 Document Revised: 07/06/2014 Elsevier Interactive Patient Education [employer].

## 2016-01-04 NOTE — Consult Note (Signed)
Reason for Consult:mass in nasopharynx Referring Physician: hospitalist  [facility] Bethany Travis is an 70 y.o. female.  HPI: Patient with a history of chest pain presented to the emergency room. 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 She is from [place] and has an oncologist there. She has had significant swelling in her neck for long time which is stable. There doesn't seem to be any erythema. She still swallowing well. She underwent a CT scan while here at [facility] which showed massive adenopathy in almost every compartment of her neck including the mediastinum as well. Many of the nodes are quite large and one on the right has a small amount of necrotic center. The CT also showed a enlargement of a area in the midline nasopharynx. This could either be lymph tissue related to the rest of the lymphadenopathy or a Thornwaldt cyst. He did have some hypodensity consistent with a possible collection within it. Consult was for determination whether any intervention needed to be performed on this area of her nasopharynx.  Past Medical History  Diagnosis Date  . Hypertension   . Chronic neck and back pain   . Leukemia ([place])     No chemo    Past Surgical History  Procedure Laterality Date  . Cervical spine surgery      History reviewed. No pertinent family history.  Social History:  reports that she has never smoked. She does not have any smokeless tobacco history on file. Her alcohol and drug histories are not on file.  Allergies: No Known Allergies  Medications: I have reviewed the patient's current medications.  Results for orders placed or performed during the hospital encounter of 01/01/16 (from the past 48 hour(s))  Lactate dehydrogenase     Status: Abnormal   Collection Time: 01/02/16  5:14 PM  Result Value Ref Range   LDH 346 (H) 98 - 192 U/L  CBC with Differential/Platelet     Status: Abnormal   Collection Time: 01/03/16  3:50 AM  Result  Value Ref Range   WBC 32.6 (H) 4.0 - 10.5 K/uL   RBC 2.69 (L) 3.87 - 5.11 MIL/uL   Hemoglobin 7.9 (L) 12.0 - 15.0 g/dL   HCT 22.2 (L) 36.0 - 46.0 %   MCV 82.5 78.0 - 100.0 fL   MCH 29.4 26.0 - 34.0 pg   MCHC 35.6 30.0 - 36.0 g/dL   RDW 15.1 11.5 - 15.5 %   Platelets 140 (L) 150 - 400 K/uL   Neutrophils Relative % 16 %   Lymphocytes Relative 74 %   Monocytes Relative 9 %   Eosinophils Relative 1 %   Basophils Relative 0 %   Neutro Abs 5.2 1.7 - 7.7 K/uL   Lymphs Abs 24.2 (H) 0.7 - 4.0 K/uL   Monocytes Absolute 2.9 (H) 0.1 - 1.0 K/uL   Eosinophils Absolute 0.3 0.0 - 0.7 K/uL   Basophils Absolute 0.0 0.0 - 0.1 K/uL   WBC Morphology ATYPICAL LYMPHOCYTES     Comment: ABSOLUTE LYMPHOCYTOSIS  Comprehensive metabolic panel     Status: Abnormal   Collection Time: 01/03/16  3:50 AM  Result Value Ref Range   Sodium 138 135 - 145 mmol/L   Potassium 3.5 3.5 - 5.1 mmol/L   Chloride 107 101 - 111 mmol/L   CO2 25 22 - 32 mmol/L   Glucose, Bld 124 (H) 65 - 99 mg/dL   BUN 19 6 - 20 mg/dL   Creatinine, Ser 0.74  0.44 - 1.00 mg/dL   Calcium 8.7 (L) 8.9 - 10.3 mg/dL   Total Protein 5.7 (L) 6.5 - 8.1 g/dL   Albumin 3.3 (L) 3.5 - 5.0 g/dL   AST 27 15 - 41 U/L   ALT 30 14 - 54 U/L   Alkaline Phosphatase 63 38 - 126 U/L   Total Bilirubin 4.1 (H) 0.3 - 1.2 mg/dL   GFR calc non Af Amer >60 >60 mL/min   GFR calc Af Amer >60 >60 mL/min    Comment: (NOTE) The eGFR has been calculated using the CKD EPI equation. This calculation has not been validated in all clinical situations. eGFR's persistently <60 mL/min signify possible Chronic Kidney Disease.    Anion gap 6 5 - 15  Prepare RBC     Status: None   Collection Time: 01/03/16  4:12 PM  Result Value Ref Range   Order Confirmation ORDER PROCESSED BY BLOOD BANK   Reticulocytes     Status: Abnormal   Collection Time: 01/03/16  4:39 PM  Result Value Ref Range   Retic Ct Pct <0.4 (L) 0.4 - 3.1 %   RBC. 2.69 (L) 3.87 - 5.11 MIL/uL   Retic Count,  Manual NOT CALCULATED 19.0 - 186.0 K/uL  Direct antiglobulin test (not at [facility])     Status: None   Collection Time: 01/03/16  4:39 PM  Result Value Ref Range   DAT, complement POS    DAT, IgG NEG   Haptoglobin     Status: Abnormal   Collection Time: 01/03/16  4:39 PM  Result Value Ref Range   Haptoglobin <10 (L) 34 - 200 mg/dL    Comment: (NOTE) Performed At: [facility] [address] [place], [place] [id number] Lindon Romp MD IR:[id number]     Ct Soft Tissue Neck W Contrast  01/02/2016  CLINICAL DATA:  70 year old female with history of leukemia and neck swelling. Initial encounter. EXAM: CT NECK WITH CONTRAST TECHNIQUE: Multidetector CT imaging of the neck was performed using the standard protocol following the bolus administration of intravenous contrast. CONTRAST:  1 ISOVUE-300 IOPAMIDOL (ISOVUE-300) INJECTION 61% COMPARISON:  None. FINDINGS: Pharynx and larynx: Posterior superior pharyngeal enlargement with 2.4 x 2 x 2.2 cm low-density component. Although large Thornwaldt cyst is a possibility, necrotic adenoidal tissue/mass or abscess are also considerations. No erosion of the adjacent clivus. Symmetric mild prominence of the palatine tonsils. Salivary glands: Adenopathy within the parotid glands greater on the left. Thyroid: No dominant mass. Lymph nodes: Marked neck adenopathy involving all stations. Adenopathy occipital region. Adenopathy axilla and increase number superior mediastinal/ supraclavicular lymph nodes. Vascular: Aortic atherosclerosis. Tortuous carotid arteries. Carotid bifurcation calcifications without hemodynamically significant stenosis. No thrombosis of the internal jugular veins which are slightly narrowed by adjacent adenopathy. Limited intracranial: No enhancing lesion. Visualized orbits: Mild exophthalmos. Mastoids and visualized paranasal sinuses: Negative. Skeleton: Cervical spondylotic changes. Post extensive decompression C2 through C6. Cord may be  tethered posteriorly but incompletely assessed. Upper chest: No dominant mass.  Minimal atelectatic changes. IMPRESSION: Marked neck adenopathy involving all stations. Adenopathy occipital region. Adenopathy axilla and increase number superior mediastinal/ supraclavicular lymph nodes. Findings highly suspicious for lymphoma. Posterior superior pharyngeal enlargement with 2.4 x 2 x 2.2 cm low-density component. Although large Thornwaldt cyst is a possibility, necrotic adenoidal tissue/mass or abscess are also considerations. No erosion of the adjacent clivus. Symmetric mild prominence of the palatine tonsils. Postsurgical changes cervical spine as noted above. Electronically Signed   By: Alcide Evener.D.  On: 01/02/2016 20:07   Mr 3d Recon At Scanner  01/03/2016  CLINICAL DATA:  Patient with elevated Burnadette Peter. EXAM: MRI ABDOMEN WITHOUT AND WITH CONTRAST (INCLUDING MRCP) TECHNIQUE: Multiplanar multisequence MR imaging of the abdomen was performed both before and after the administration of intravenous contrast. Heavily T2-weighted images of the biliary and pancreatic ducts were obtained, and three-dimensional MRCP images were rendered by post processing. CONTRAST:  11m MULTIHANCE GADOBENATE DIMEGLUMINE 529 MG/ML IV SOLN COMPARISON:  None. FINDINGS: Lower chest: Enlarged axillary lymph nodes are identified. No pleural or pericardial effusion identified. Hepatobiliary: No enhancing liver abnormalities identified. Previous cholecystectomy. No intrahepatic bile duct dilatation. The common bile duct measures 1 cm in maximum diameter. No choledocholithiasis identified. Pancreas: Normal appearance of the pancreas. The pancreatic duct appears normal. Spleen: The spleen measures 11 cm in length. No focal splenic abnormality identified. Adrenals/Urinary Tract: Normal appearance of the adrenal glands. The left kidney appears normal. Small cyst in the right kidney measures 5 mm, image 95 of series 1501. Stomach/Bowel: The  stomach appears normal. The upper abdominal bowel loops have a normal course and caliber. No bowel obstruction. Vascular/Lymphatic: Normal appearance of the abdominal aorta. Peripancreatic lymph node is enlarged measuring 1.3 cm. No retroperitoneal adenopathy. Other:  No free fluid or fluid collections. Musculoskeletal:  No suspicious bone lesions identified. IMPRESSION: 1. Increase caliber of the common bile duct status post cholecystectomy. No intrahepatic bile duct dilatation noted. The no choledocholithiasis or obstructing mass noted. 2. Enlarged bilateral axillary lymph nodes. There is also an enlarged peripancreatic node within the upper abdomen. Electronically Signed   By: TKerby MoorsM.D.   On: 01/03/2016 13:18   Nm Myocar Multi W/spect W/wall Motion / Ef  01/03/2016  CLINICAL DATA:  Chest pain EXAM: MYOCARDIAL IMAGING WITH SPECT (REST AND PHARMACOLOGIC-STRESS) GATED LEFT VENTRICULAR WALL MOTION STUDY LEFT VENTRICULAR EJECTION FRACTION TECHNIQUE: Standard myocardial SPECT imaging was performed after resting intravenous injection of 10 mCi Tc-940metrofosmin. Subsequently, intravenous infusion of Lexiscan was performed under the supervision of the Cardiology staff. At peak effect of the drug, 30 mCi [id number] was injected intravenously and standard myocardial SPECT imaging was performed. Quantitative gated imaging was also performed to evaluate left ventricular wall motion, and estimate left ventricular ejection fraction. COMPARISON:  None. FINDINGS: Perfusion: There is a moderate-sized fixed perfusion defect at the apex which extends into the lateral wall. No definite stress-induced ischemia. Wall Motion: Normal left ventricular wall motion. No left ventricular dilation. Left Ventricular Ejection Fraction: 89 % End diastolic volume 60 ml End systolic volume 7 ml IMPRESSION: 1. There is a fixed perfusion defect at the apex but no definite stress-induced ischemia. 2. Normal left ventricular wall  motion. 3. Left ventricular ejection fraction 89% 4. Non invasive risk stratification*: Low risk *2012 Appropriate Use Criteria for Coronary Revascularization Focused Update: J Am Coll Cardiol. 2010865;78(4):[email or web address] Electronically Signed   By: ArtMarybelle KillingsD.   On: 01/03/2016 13:17   Mr Abd W/wo Cm/mrcp  01/03/2016  CLINICAL DATA:  Patient with elevated bilBurnadette PeterXAM: MRI ABDOMEN WITHOUT AND WITH CONTRAST (INCLUDING MRCP) TECHNIQUE: Multiplanar multisequence MR imaging of the abdomen was performed both before and after the administration of intravenous contrast. Heavily T2-weighted images of the biliary and pancreatic ducts were obtained, and three-dimensional MRCP images were rendered by post processing. CONTRAST:  34m84mLTIHANCE GADOBENATE DIMEGLUMINE 529 MG/ML IV SOLN COMPARISON:  None. FINDINGS: Lower chest: Enlarged axillary lymph nodes are identified. No pleural or pericardial effusion identified. Hepatobiliary: No enhancing  liver abnormalities identified. Previous cholecystectomy. No intrahepatic bile duct dilatation. The common bile duct measures 1 cm in maximum diameter. No choledocholithiasis identified. Pancreas: Normal appearance of the pancreas. The pancreatic duct appears normal. Spleen: The spleen measures 11 cm in length. No focal splenic abnormality identified. Adrenals/Urinary Tract: Normal appearance of the adrenal glands. The left kidney appears normal. Small cyst in the right kidney measures 5 mm, image 95 of series 1501. Stomach/Bowel: The stomach appears normal. The upper abdominal bowel loops have a normal course and caliber. No bowel obstruction. Vascular/Lymphatic: Normal appearance of the abdominal aorta. Peripancreatic lymph node is enlarged measuring 1.3 cm. No retroperitoneal adenopathy. Other:  No free fluid or fluid collections. Musculoskeletal:  No suspicious bone lesions identified. IMPRESSION: 1. Increase caliber of  the common bile duct status post cholecystectomy. No intrahepatic bile duct dilatation noted. The no choledocholithiasis or obstructing mass noted. 2. Enlarged bilateral axillary lymph nodes. There is also an enlarged peripancreatic node within the upper abdomen. Electronically Signed   By: Kerby Moors M.D.   On: 01/03/2016 13:18   US Abdomen Limited Ruq  01/02/2016  CLINICAL DATA:  Hyperbilirubinemia, history hypertension, leukemia EXAM: US ABDOMEN LIMITED - RIGHT UPPER QUADRANT COMPARISON:  None FINDINGS: Gallbladder: Remote cholecystectomy by history 30+ years ago. Common bile duct: Diameter: 10 mm diameter, dilated.  Distal CBD not visualized. Liver: Normal echogenicity. No definite mass lesion or nodularity. No significant intrahepatic biliary dilatation. Hepatopetal portal venous flow. Grossly smooth hepatic contours. No RIGHT upper quadrant free fluid. IMPRESSION: Extrahepatic biliary dilatation with CBD 10 mm diameter, question physiologic dilatation due to remote cholecystectomy versus distal CBD obstruction ; recommend correlation with LFTs. Consider follow-up MRCP imaging to exclude distal CBD obstruction. Electronically Signed   By: Lavonia Dana M.D.   On: 01/02/2016 18:43    ROS Blood pressure 97/50, pulse 65, temperature 98.3 F (36.8 C), temperature source Oral, resp. rate 20, height 5' (1.524 m), weight 78.563 kg (173 lb 3.2 oz), SpO2 95 %. Physical Exam  Constitutional: She appears well-nourished.  HENT:  Head: Normocephalic.  Nose: Nose normal.  Mouth/Throat: Oropharynx is clear and moist.  Awake and alert. She does not look sick or toxic. She is speaking well. Nose clear.oc/op- there is no swelling of the posterior wall or drainage. The palate is nl and tonsils are +2-3 bilaterally equal. Neck has adenopathy in all zones   Eyes: Conjunctivae are normal. Pupils are equal, round, and reactive to light.    Assessment/Plan: Neck swelling and nasopharyngeal mass-I suspect this area  in her nasopharynx is just more of the same of her massive adenopathy with enlarged lymph tissue which may have some necrotic component. Right now it's a 2 cm small area that I do not think any intervention is necessary. I would recommend some intravenous antibiotics which is already done to see if she responds. Certainly the fever could be from any number of nodes many of which have a slight necrotic center but also it could be from the leukemia diagnosis. There is no swelling of the posterior wall and she looks nontoxic. I would think there would be swelling if this nasopharyngeal area was an abscess. I will follow up in few days unless she is worse please call.  Melissa Montane 01/04/2016, 6:06 AM

## 2016-01-04 NOTE — Plan of Care (Signed)
Problem: Phase II Progression Outcomes Goal: Hemodynamically stable Outcome: Progressing VSS. Hgb and Hct improved post Blood Transfusion.

## 2016-01-04 NOTE — Progress Notes (Signed)
Discharge instructions received and discussed with pt and family.  Printed Spanish/English D/C instructions provided  to pt and family. Prescriptions given. Verbalized understanding. No questions or concerns expressed.

## 2016-01-04 NOTE — Discharge Summary (Signed)
Physician Discharge Summary  Bethany Travis [id number] DOB: Feb 28, 1946 DOA: 01/01/2016  PCP: Pcp Not In System  Admit date: 01/01/2016 Discharge date: 01/04/2016  Time spent: 35 minutes  Recommendations for Outpatient Follow-up:  1. During this hospitalization She had a soft tissue CT of neck with contrast that revealed innumerable neck lymph nodes. She also had low-grade fever. 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 She was instructed to follow-up with her oncologist early this week 2. 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 She was transfused during this hospitalization, had hemoglobin of 10 on day of discharge. Please follow-up on CBC 3. She was discharged on 01/04/2016, lasted for back to [facility] on 01/05/2016 and follow-up with her oncologist early next week   Discharge Diagnoses:  Principal Problem:   Chest pain Active Problems:   Leukemia ([place])   Dyspnea   Anemia   GERD (gastroesophageal reflux disease)   Essential hypertension   Pain in the chest   Common bile duct dilatation   Absolute anemia   Discharge Condition: Stable  Diet recommendation: Regular diet  Filed Weights   01/02/16 0454 01/03/16 0407 01/04/16 0415  Weight: 78.2 kg (172 lb 6.4 oz) 78.1 kg (172 lb 2.9 oz) 78.563 kg (173 lb 3.2 oz)    History of present illness:  Bethany Travis is a 70 y.o. 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 Patient is Spanish-speaking and her nephew acts as a Optometrist. She came to [place] by automobile to visit family from [place], [place]. She reports having this left-sided chest pressure/discomfort whenever standing or exerting herself.  Feels like she may pass out, but she has never lost consciousness. Symptoms will last anywhere from 30 minutes to an hour before self resolving. Patient denies trying anything besides resting to alleviate symptoms. Symptoms have not occurred while resting. Associated symptoms during these episodes include fatigue, shortness of breath, palpitations, and dizziness. She denies any cough, hemoptysis, dysuria, abdominal pain, diaphoresis, fevers, calf swelling, or dysuria. Patient denies any significant history of tobacco, alcohol, or illicit drug use. She is not on any blood thinners, but takes a 81 mg aspirin daily.She reports having okay appetite with normal oral intake. Patient has had a known diagnosis of leukemia which she says her primary care provider is following with lab work every 3 months or so. She declined any treatment so because she was fearful of chemotherapy. Of note she is scheduled to have a back operation in the near future.   Hospital Course:  Bethany Travis is a 70 year old female with a past medical history of hypertension, leukemia, originally from [place] visiting family members in town when she presented with complaints of retrosternal chest pain associated with shortness of breath dizziness and palpitations. She recently drove in from [place], [place]. She reported having history of aortic valve disease and seen a cardiologist in [place] as well as in the [place] Chest x-ray did not reveal acute cardiopulmonary disease. Troponin negative. She was placed in overnight observation as repeat troponins have remained negative. On this mornings evaluation she reported resolution to chest pain symptoms. She was seen and evaluated by cardiology. A transthoracic echocardiogram revealed an ejection fraction of 65-70% without evidence of aortic valve stenosis.   . Chest  pain. -Bethany Travis presenting with a three-day history of retrosternal chest pain associated with  shortness of breath. -CT scan of lungs with IV contrast did not reveal evidence for pulmonary embolism. -Troponins were cycled and remained negative 3 sets. -Transthoracic echocardiogram did not reveal wall motion abnormalities, having preserved ejection fraction without valvular abnormalities. -Stress test performed on 01/03/2016 did not reveal evidence of reversible ischemia  2. Suspected history of CLL -She reports having been diagnosed with leukemia in [place] reporting that she did not undergo treatment -Lab work showing a white count of 36,000 with predominance of lymphocytes. -On 01/02/2016 she developed low-grade fever was started on empiric IV antibiotic therapy with Zosyn. -Having significant neck adenopathy on physical examination, she was further worked up with a soft tissue CT scan of the neck performed on 01/02/2016 that revealed innumerable neck adenopathy consistent with lymphoma. Also noted was superior pharyngeal enlargement with 2. 2 x 2 by 2.2 cm low-density components that could represent necrotic adenoidal tissue/mass versus abscess. These findings were discussed with ENT who felt this likely represented adenoidal tissue. He did not recommend needle aspiration for the time being. -On 01/03/2016 I discussed case with Dr. 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 He reported that her enlarging lymph nodes would be consistent with this. -She is currently visiting the area from [place] and plans to fly back home tomorrow. Family members reassured me that she would be following up with her oncologist early next week.  3. 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 Her LDH was 346. -I discussed this with Dr. Marin Olp of medical oncology who felt this could be related to hemolytic anemia  -Coombs test was  positive -She was discharged on prednisone -During this hospitalization she received blood transfusion, with hemoglobin improving from 7.9-10.0  4. Hyperbilirubinemia -Lab work showing elevated bilirubin of 4.1. -This had been further worked up with right upper quadrant ultrasound that showed common bile duct measuring 10 mm in diameter. It was unclear if this was related to remote cholecystectomy or to distal common bile duct obstruction, radiology recommended MRCP to exclude obstruction. -MRCP performed on 01/03/2016 did not reveal choledocholithiasis or obstructing mass. Pancreas had normal appearance and pancreatic duct appearing normal as well. -Right upper quadrant ultrasound had shown normal echogenicity of liver without definite mass lesions or nodularity. -Could be related to hemolytic anemia  5. Hypertension -Stable, continue nifedipine at 30 mg by mouth daily   Consultations:  Cardiology  Medical oncology  ENT  Discharge Exam: Filed Vitals:   01/04/16 0852 01/04/16 0908  BP: 95/48 107/53  Pulse: 68 65  Temp:    Resp: 14 15    General exam: She appears mildly icteric HEENT: She has multiple bilateral neck adenopathy, a few were painful to palpation Respiratory system: Clear to auscultation. Respiratory effort normal. Cardiovascular system: S1 & S2 heard, RRR. I did not appreciate murmurs Gastrointestinal system: Abdomen is nondistended, soft and nontender. No organomegaly or masses felt. Normal bowel sounds heard. Central nervous system: Alert and oriented. No focal neurological deficits. Extremities: Symmetric 5 x 5 power. Skin: No rashes, lesions or ulcers Psychiatry: Judgement and insight appear normal. Mood & affect appropriate.   Discharge Instructions   Discharge Instructions    Call MD for:  difficulty breathing, headache or visual disturbances    Complete by:  As directed  Call MD for:  extreme fatigue    Complete by:  As directed      Call MD  for:  hives    Complete by:  As directed      Call MD for:  persistant dizziness or light-headedness    Complete by:  As directed      Call MD for:  persistant nausea and vomiting    Complete by:  As directed      Call MD for:  redness, tenderness, or signs of infection (pain, swelling, redness, odor or green/yellow discharge around incision site)    Complete by:  As directed      Call MD for:  severe uncontrolled pain    Complete by:  As directed      Call MD for:  temperature >100.4    Complete by:  As directed      Call MD for:    Complete by:  As directed      Diet - low sodium heart healthy    Complete by:  As directed      Increase activity slowly    Complete by:  As directed           Current Discharge Medication List    START taking these medications   Details  fluconazole (DIFLUCAN) 100 MG tablet Take 1 tablet (100 mg total) by mouth daily. 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 Qty: 30 tablet, Refills: 0    oxyCODONE (ROXICODONE) 5 MG immediate release tablet Take 1 tablet (5 mg total) by mouth every 4 (four) hours as needed for severe pain. Qty: 15 tablet, Refills: 0    predniSONE (STERAPRED UNI-PAK 21 TAB) 10 MG (21) TBPK tablet 60 mg PO q daily x 7 days Qty: 21 tablet, Refills: 0    sulfamethoxazole-trimethoprim (BACTRIM DS,SEPTRA DS) 800-160 MG tablet Take 1 tablet by mouth daily. 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    pantoprazole (PROTONIX) 40 MG tablet Take 40 mg by mouth daily.      STOP taking these medications     aspirin EC 81 MG tablet        No Known  Allergies    The results of significant diagnostics from this hospitalization (including imaging, microbiology, ancillary and laboratory) are listed below for reference.    Significant Diagnostic Studies: Dg Chest 2 View  01/01/2016  CLINICAL DATA:  Chest pain for 3 days, dizziness EXAM: CHEST  2 VIEW COMPARISON:  None. FINDINGS: Normal mediastinum and cardiac silhouette. Normal pulmonary vasculature. No evidence of effusion, infiltrate, or pneumothorax. No acute bony abnormality. IMPRESSION: No acute cardiopulmonary process. Electronically Signed   By: Suzy Bouchard M.D.   On: 01/01/2016 17:54   Ct Soft Tissue Neck W Contrast  01/02/2016  CLINICAL DATA:  70 year old female with history of leukemia and neck swelling. Initial encounter. EXAM: CT NECK WITH CONTRAST TECHNIQUE: Multidetector CT imaging of the neck was performed using the standard protocol following the bolus administration of intravenous contrast. CONTRAST:  1 ISOVUE-300 IOPAMIDOL (ISOVUE-300) INJECTION 61% COMPARISON:  None. FINDINGS: Pharynx and larynx: Posterior superior  pharyngeal enlargement with 2.4 x 2 x 2.2 cm low-density component. Although large Thornwaldt cyst is a possibility, necrotic adenoidal tissue/mass or abscess are also considerations. No erosion of the adjacent clivus. Symmetric mild prominence of the palatine tonsils. Salivary glands: Adenopathy within the parotid glands greater on the left. Thyroid: No dominant mass. Lymph nodes: Marked neck adenopathy involving all stations. Adenopathy occipital region. Adenopathy axilla and increase number superior mediastinal/ supraclavicular lymph nodes. Vascular: Aortic atherosclerosis. Tortuous carotid arteries. Carotid bifurcation calcifications without hemodynamically significant stenosis. No thrombosis of the internal jugular veins which are slightly narrowed by adjacent adenopathy. Limited intracranial: No enhancing lesion. Visualized orbits: Mild exophthalmos. Mastoids and  visualized paranasal sinuses: Negative. Skeleton: Cervical spondylotic changes. Post extensive decompression C2 through C6. Cord may be tethered posteriorly but incompletely assessed. Upper chest: No dominant mass.  Minimal atelectatic changes. IMPRESSION: Marked neck adenopathy involving all stations. Adenopathy occipital region. Adenopathy axilla and increase number superior mediastinal/ supraclavicular lymph nodes. Findings highly suspicious for lymphoma. Posterior superior pharyngeal enlargement with 2.4 x 2 x 2.2 cm low-density component. Although large Thornwaldt cyst is a possibility, necrotic adenoidal tissue/mass or abscess are also considerations. No erosion of the adjacent clivus. Symmetric mild prominence of the palatine tonsils. Postsurgical changes cervical spine as noted above. Electronically Signed   By: Genia Del M.D.   On: 01/02/2016 20:07   Ct Angio Chest Pe W/cm &/or Wo Cm  01/01/2016  CLINICAL DATA:  Chest pain and shortness of breath for 2 days, history hypertension, leukemia EXAM: CT ANGIOGRAPHY CHEST WITH CONTRAST TECHNIQUE: Multidetector CT imaging of the chest was performed using the standard protocol during bolus administration of intravenous contrast. Multiplanar CT image reconstructions and MIPs were obtained to evaluate the vascular anatomy. CONTRAST:  80 cc Isovue 370 IV COMPARISON:  None FINDINGS: Cardiovascular: Atherosclerotic calcifications aorta without aneurysm or dissection. Pulmonary arteries patent. No evidence of pulmonary embolism. Upper normal size of cardiac chambers. Mediastinum/Nodes: Esophagus unremarkable. Upper normal sized RIGHT retropectoral nodes up to 10 mm and 9 mm diameter. Base of cervical region unremarkable. Numerous normal, upper normal, and borderline enlarged axillary nodes bilaterally. No mediastinal or hilar adenopathy. Lungs/Pleura: Dependent atelectasis in the posterior lower lobes. Remaining lungs clear without infiltrate, pleural effusion or  pneumothorax. Upper Abdomen: Question minimal splenic enlargement though spleen incompletely visualized. Remaining visualized upper abdomen normal appearance. Musculoskeletal: No acute thoracic osseous abnormalities. Review of the MIP images confirms the above findings. IMPRESSION: No evidence of pulmonary embolism. Dependent atelectasis in the posterior lower lobes bilaterally. Aortic atherosclerosis. Upper normal sized to borderline enlarged retropectoral and axillary lymph nodes. Electronically Signed   By: Lavonia Dana M.D.   On: 01/01/2016 19:36   Mr 3d Recon At Scanner  01/03/2016  CLINICAL DATA:  Patient with elevated Burnadette Peter. EXAM: MRI ABDOMEN WITHOUT AND WITH CONTRAST (INCLUDING MRCP) TECHNIQUE: Multiplanar multisequence MR imaging of the abdomen was performed both before and after the administration of intravenous contrast. Heavily T2-weighted images of the biliary and pancreatic ducts were obtained, and three-dimensional MRCP images were rendered by post processing. CONTRAST:  36mL MULTIHANCE GADOBENATE DIMEGLUMINE 529 MG/ML IV SOLN COMPARISON:  None. FINDINGS: Lower chest: Enlarged axillary lymph nodes are identified. No pleural or pericardial effusion identified. Hepatobiliary: No enhancing liver abnormalities identified. Previous cholecystectomy. No intrahepatic bile duct dilatation. The common bile duct measures 1 cm in maximum diameter. No choledocholithiasis identified. Pancreas: Normal appearance of the pancreas. The pancreatic duct appears normal. Spleen: The spleen measures 11 cm in length. No focal splenic  abnormality identified. Adrenals/Urinary Tract: Normal appearance of the adrenal glands. The left kidney appears normal. Small cyst in the right kidney measures 5 mm, image 95 of series 1501. Stomach/Bowel: The stomach appears normal. The upper abdominal bowel loops have a normal course and caliber. No bowel obstruction. Vascular/Lymphatic: Normal appearance of the abdominal aorta.  Peripancreatic lymph node is enlarged measuring 1.3 cm. No retroperitoneal adenopathy. Other:  No free fluid or fluid collections. Musculoskeletal:  No suspicious bone lesions identified. IMPRESSION: 1. Increase caliber of the common bile duct status post cholecystectomy. No intrahepatic bile duct dilatation noted. The no choledocholithiasis or obstructing mass noted. 2. Enlarged bilateral axillary lymph nodes. There is also an enlarged peripancreatic node within the upper abdomen. Electronically Signed   By: Kerby Moors M.D.   On: 01/03/2016 13:18   Nm Myocar Multi W/spect W/wall Motion / Ef  01/03/2016  CLINICAL DATA:  Chest pain EXAM: MYOCARDIAL IMAGING WITH SPECT (REST AND PHARMACOLOGIC-STRESS) GATED LEFT VENTRICULAR WALL MOTION STUDY LEFT VENTRICULAR EJECTION FRACTION TECHNIQUE: Standard myocardial SPECT imaging was performed after resting intravenous injection of 10 mCi Tc-14m tetrofosmin. Subsequently, intravenous infusion of Lexiscan was performed under the supervision of the Cardiology staff. At peak effect of the drug, 30 mCi Tc-48m tetrofosmin was injected intravenously and standard myocardial SPECT imaging was performed. Quantitative gated imaging was also performed to evaluate left ventricular wall motion, and estimate left ventricular ejection fraction. COMPARISON:  None. FINDINGS: Perfusion: There is a moderate-sized fixed perfusion defect at the apex which extends into the lateral wall. No definite stress-induced ischemia. Wall Motion: Normal left ventricular wall motion. No left ventricular dilation. Left Ventricular Ejection Fraction: 89 % End diastolic volume 60 ml End systolic volume 7 ml IMPRESSION: 1. There is a fixed perfusion defect at the apex but no definite stress-induced ischemia. 2. Normal left ventricular wall motion. 3. Left ventricular ejection fraction 89% 4. Non invasive risk stratification*: Low risk *2012 Appropriate Use Criteria for Coronary Revascularization Focused Update:  J Am Coll Cardiol. N6492421. http://content.airportbarriers.com.aspx?articleid=[email or web address] Electronically Signed   By: Marybelle Killings M.D.   On: 01/03/2016 13:17   Mr Abd W/wo Cm/mrcp  01/03/2016  CLINICAL DATA:  Patient with elevated Burnadette Peter. EXAM: MRI ABDOMEN WITHOUT AND WITH CONTRAST (INCLUDING MRCP) TECHNIQUE: Multiplanar multisequence MR imaging of the abdomen was performed both before and after the administration of intravenous contrast. Heavily T2-weighted images of the biliary and pancreatic ducts were obtained, and three-dimensional MRCP images were rendered by post processing. CONTRAST:  78mL MULTIHANCE GADOBENATE DIMEGLUMINE 529 MG/ML IV SOLN COMPARISON:  None. FINDINGS: Lower chest: Enlarged axillary lymph nodes are identified. No pleural or pericardial effusion identified. Hepatobiliary: No enhancing liver abnormalities identified. Previous cholecystectomy. No intrahepatic bile duct dilatation. The common bile duct measures 1 cm in maximum diameter. No choledocholithiasis identified. Pancreas: Normal appearance of the pancreas. The pancreatic duct appears normal. Spleen: The spleen measures 11 cm in length. No focal splenic abnormality identified. Adrenals/Urinary Tract: Normal appearance of the adrenal glands. The left kidney appears normal. Small cyst in the right kidney measures 5 mm, image 95 of series 1501. Stomach/Bowel: The stomach appears normal. The upper abdominal bowel loops have a normal course and caliber. No bowel obstruction. Vascular/Lymphatic: Normal appearance of the abdominal aorta. Peripancreatic lymph node is enlarged measuring 1.3 cm. No retroperitoneal adenopathy. Other:  No free fluid or fluid collections. Musculoskeletal:  No suspicious bone lesions identified. IMPRESSION: 1. Increase caliber of the common bile duct status post cholecystectomy. No intrahepatic bile duct dilatation noted. The  no choledocholithiasis or obstructing mass noted. 2. Enlarged bilateral  axillary lymph nodes. There is also an enlarged peripancreatic node within the upper abdomen. Electronically Signed   By: Kerby Moors M.D.   On: 01/03/2016 13:18   US Abdomen Limited Ruq  01/02/2016  CLINICAL DATA:  Hyperbilirubinemia, history hypertension, leukemia EXAM: US ABDOMEN LIMITED - RIGHT UPPER QUADRANT COMPARISON:  None FINDINGS: Gallbladder: Remote cholecystectomy by history 30+ years ago. Common bile duct: Diameter: 10 mm diameter, dilated.  Distal CBD not visualized. Liver: Normal echogenicity. No definite mass lesion or nodularity. No significant intrahepatic biliary dilatation. Hepatopetal portal venous flow. Grossly smooth hepatic contours. No RIGHT upper quadrant free fluid. IMPRESSION: Extrahepatic biliary dilatation with CBD 10 mm diameter, question physiologic dilatation due to remote cholecystectomy versus distal CBD obstruction ; recommend correlation with LFTs. Consider follow-up MRCP imaging to exclude distal CBD obstruction. Electronically Signed   By: Lavonia Dana M.D.   On: 01/02/2016 18:43    Microbiology: Recent Results (from the past 240 hour(s))  Culture, blood (Routine X 2) w Reflex to ID Panel     Status: None (Preliminary result)   Collection Time: 01/02/16  5:05 PM  Result Value Ref Range Status   Specimen Description BLOOD RIGHT ARM  Final   Special Requests BOTTLES DRAWN AEROBIC AND ANAEROBIC [place]   Final   Culture NO GROWTH 2 DAYS  Final   Report Status PENDING  Incomplete  Culture, blood (Routine X 2) w Reflex to ID Panel     Status: None (Preliminary result)   Collection Time: 01/02/16  5:14 PM  Result Value Ref Range Status   Specimen Description BLOOD RIGHT HAND  Final   Special Requests IN PEDIATRIC BOTTLE 2.5CC  Final   Culture NO GROWTH 2 DAYS  Final   Report Status PENDING  Incomplete     Labs: Basic Metabolic Panel:  Recent Labs Lab 01/01/16 1720 01/02/16 0400 01/03/16 0350 01/04/16 0703  NA 136 138 138 135  K 4.3 3.5 3.5 3.5  CL 105  107 107 103  CO2 23 25 25 24   GLUCOSE 121* 113* 124* 158*  BUN 25* 20 19 25*  CREATININE 0.65 0.58 0.74 0.73  CALCIUM 9.6 8.8* 8.7* 8.8*   Liver Function Tests:  Recent Labs Lab 01/02/16 0400 01/03/16 0350  AST 24 27  ALT 21 30  ALKPHOS 66 63  BILITOT 4.0* 4.1*  PROT 5.7* 5.7*  ALBUMIN 3.4* 3.3*   No results for input(s): LIPASE, AMYLASE in the last 168 hours. No results for input(s): AMMONIA in the last 168 hours. CBC:  Recent Labs Lab 01/01/16 1720 01/02/16 0400 01/03/16 0350 01/04/16 0703  WBC 48.4* 36.0* 32.6* 48.5*  NEUTROABS  --  4.3 5.2  --   HGB 10.0* 8.2* 7.9* 10.0*  HCT 28.9* 23.8* 22.2* 29.5*  MCV 84.8 83.2 82.5 81.7  PLT 189 156 140* 144*   Cardiac Enzymes:  Recent Labs Lab 01/01/16 2207 01/02/16 0010 01/02/16 0400  TROPONINI <0.03 <0.03 <0.03   BNP: BNP (last 3 results) No results for input(s): BNP in the last 8760 hours.  ProBNP (last 3 results) No results for input(s): PROBNP in the last 8760 hours.  CBG: No results for input(s): GLUCAP in the last 168 hours.     Signed:  Kelvin Cellar MD.  [facility] 01/04/2016, 12:11 PM

## 2016-01-05 LAB — TYPE AND SCREEN
ABO/RH(D): A POS
Antibody Screen: NEGATIVE
UNIT DIVISION: 0
Unit division: 0

## 2016-01-07 LAB — CULTURE, BLOOD (ROUTINE X 2)
CULTURE: NO GROWTH
CULTURE: NO GROWTH

## 2017-09-10 ENCOUNTER — Emergency Department (HOSPITAL_COMMUNITY): Payer: Medicare Other

## 2017-09-10 ENCOUNTER — Other Ambulatory Visit: Payer: Self-pay

## 2017-09-10 ENCOUNTER — Encounter (HOSPITAL_COMMUNITY): Payer: Self-pay | Admitting: *Deleted

## 2017-09-10 ENCOUNTER — Emergency Department (HOSPITAL_COMMUNITY)
Admission: EM | Admit: 2017-09-10 | Discharge: 2017-09-10 | Disposition: A | Discharge status: Home / Self Care | Payer: Medicare Other | Attending: Emergency Medicine | Admitting: Emergency Medicine

## 2017-09-10 DIAGNOSIS — Z79899 Other long term (current) drug therapy: Secondary | ICD-10-CM | POA: Diagnosis not present

## 2017-09-10 DIAGNOSIS — R0789 Other chest pain: Secondary | ICD-10-CM | POA: Insufficient documentation

## 2017-09-10 DIAGNOSIS — R002 Palpitations: Secondary | ICD-10-CM | POA: Diagnosis present

## 2017-09-10 DIAGNOSIS — I10 Essential (primary) hypertension: Secondary | ICD-10-CM | POA: Diagnosis not present

## 2017-09-10 DIAGNOSIS — R0602 Shortness of breath: Secondary | ICD-10-CM | POA: Diagnosis not present

## 2017-09-10 LAB — CBC
HCT: 37.7 % (ref 36.0–46.0)
HEMOGLOBIN: 13.1 g/dL (ref 12.0–15.0)
MCH: 28.6 pg (ref 26.0–34.0)
MCHC: 34.7 g/dL (ref 30.0–36.0)
MCV: 82.3 fL (ref 78.0–100.0)
Platelets: 221 10*3/uL (ref 150–400)
RBC: 4.58 MIL/uL (ref 3.87–5.11)
RDW: 14.5 % (ref 11.5–15.5)
WBC: 7.1 10*3/uL (ref 4.0–10.5)

## 2017-09-10 LAB — BASIC METABOLIC PANEL
ANION GAP: 10 (ref 5–15)
BUN: 12 mg/dL (ref 6–20)
CHLORIDE: 106 mmol/L (ref 101–111)
CO2: 24 mmol/L (ref 22–32)
Calcium: 9.5 mg/dL (ref 8.9–10.3)
Creatinine, Ser: 0.81 mg/dL (ref 0.44–1.00)
GFR calc non Af Amer: >60 mL/min (ref >60)
Glucose, Bld: 98 mg/dL (ref 65–99)
POTASSIUM: 4 mmol/L (ref 3.5–5.1)
Sodium: 140 mmol/L (ref 135–145)

## 2017-09-10 LAB — I-STAT TROPONIN, ED
TROPONIN I, POC: 0 ng/mL (ref 0.00–0.08)
TROPONIN I, POC: 0 ng/mL (ref 0.00–0.08)

## 2017-09-10 LAB — BRAIN NATRIURETIC PEPTIDE: B Natriuretic Peptide: 51 pg/mL (ref 0.0–100.0)

## 2017-09-10 LAB — TSH: TSH: 2.192 u[IU]/mL (ref 0.350–4.500)

## 2017-09-10 LAB — T4, FREE: Free T4: 1.04 ng/dL (ref 0.61–1.12)

## 2017-09-10 LAB — MAGNESIUM: Magnesium: 2.1 mg/dL (ref 1.7–2.4)

## 2017-09-10 MED ORDER — NIFEDIPINE ER OSMOTIC RELEASE 30 MG PO TB24: 30.0000 mg | ORAL_TABLET | Freq: Every day | ORAL | 0 refills | Status: DC

## 2017-09-10 NOTE — ED Notes (Signed)
Patient verbalizes understanding of discharge instructions. Opportunity for questioning and answers were provided. Armband removed by staff, pt discharged from ED ambulatory.   

## 2017-09-10 NOTE — ED Triage Notes (Signed)
Pt reports mild chest discomfort and palpitations for several days. Has mild sob. No acute distress is noted and ekg done at triage. Airway intact, skin w/d.

## 2017-09-10 NOTE — ED Notes (Signed)
ED Provider at bedside. 

## 2017-09-10 NOTE — ED Provider Notes (Signed)
[facility] DE[facility] Provider Note   CSN: [id number] Arrival date & time: 09/10/17  1000     History   Chief Complaint Chief Complaint  Patient presents with  . Palpitations  . Chest Pain    HPI Jacquel Redditt Evon Slack is a 72 y.o. female.  The history is provided by the patient.  Palpitations   This is a recurrent problem. Episode onset: 3wks ago. Episode frequency: about every other day. The problem has been gradually worsening. Associated with: at night and laying down. On average, each episode lasts 1 hour. Associated symptoms include chest pain and chest pressure. Pertinent negatives include no diaphoresis, no fever, no exertional chest pressure, no irregular heartbeat, no orthopnea, no abdominal pain, no nausea, no vomiting, no headaches, no back pain, no leg pain, no lower extremity edema, no cough and no shortness of breath.     Past Medical History:  Diagnosis Date  . Chronic neck and back pain   . Hypertension   . 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 Pain in the chest   . Common bile duct dilatation   . Absolute anemia   . Leukemia ([place]) 01/02/2016  . Dyspnea 01/02/2016  . Anemia 01/02/2016  . GERD (gastroesophageal reflux disease) 01/02/2016  . Essential hypertension 01/02/2016  . Chest pain 01/01/2016    Past Surgical History:  Procedure Laterality Date  . CERVICAL SPINE SURGERY      OB History    No data available       Home Medications    Prior to Admission medications   Medication Sig Start Date End Date Taking? Authorizing Provider  fluconazole (DIFLUCAN) 100 MG tablet Take 1 tablet (100 mg total) by mouth daily. Patient not taking: Reported on 09/10/2017 01/04/16   Kelvin Cellar, MD  folic acid (FOLVITE) 1 MG tablet Take 2 tablets (2 mg total) by mouth daily. Patient not taking: Reported on 09/10/2017 01/04/16   Kelvin Cellar, MD  NIFEdipine  (PROCARDIA-XL/ADALAT-CC/NIFEDICAL-XL) 30 MG 24 hr tablet Take 1 tablet (30 mg total) by mouth daily. 09/10/17   Tobie Poet, DO  oxyCODONE (ROXICODONE) 5 MG immediate release tablet Take 1 tablet (5 mg total) by mouth every 4 (four) hours as needed for severe pain. Patient not taking: Reported on 09/10/2017 01/04/16   Kelvin Cellar, MD  predniSONE (STERAPRED UNI-PAK 21 TAB) 10 MG (21) TBPK tablet 60 mg PO q daily x 7 days Patient not taking: Reported on 09/10/2017 01/04/16   Kelvin Cellar, MD  sulfamethoxazole-trimethoprim (BACTRIM DS,SEPTRA DS) 800-160 MG tablet Take 1 tablet by mouth daily. Patient not taking: Reported on 09/10/2017 01/04/16   Kelvin Cellar, MD    Family History History reviewed. No pertinent family history.  Social History Social History   Tobacco Use  . Smoking status: Never Smoker  Substance Use Topics  . Alcohol use: Not on file  . Drug use: Not on file     Allergies   Cefepime   Review of Systems Review of Systems  Constitutional: Negative for activity change, chills, diaphoresis and fever.  Eyes: Negative for pain and visual disturbance.  Respiratory: Negative for cough and shortness of breath.   Cardiovascular: Positive for chest pain and palpitations. Negative for orthopnea and leg swelling.  Gastrointestinal: Negative for abdominal pain, nausea and vomiting.  Genitourinary: Negative for dysuria.  Musculoskeletal: Negative for back pain.  Skin: Negative for rash.  Neurological: Negative for  syncope, light-headedness and headaches.  All other systems reviewed and are negative.    Physical Exam Updated Vital Signs BP (!) 175/76 (BP Location: Left Arm)   Pulse (!) 58   Temp 97.6 F (36.4 C) (Oral)   Resp 16   SpO2 99%   Physical Exam  Constitutional: She is oriented to person, place, and time. She appears well-developed and well-nourished. She does not appear ill. No distress.  HENT:  Head: Normocephalic and atraumatic.  Eyes: Conjunctivae  are normal.  Neck: Neck supple.  Cardiovascular: Normal rate, regular rhythm, intact distal pulses and normal pulses. Exam reveals no friction rub.  No murmur heard. Pulmonary/Chest: Effort normal and breath sounds normal. No respiratory distress. She has no decreased breath sounds. She has no wheezes. She has no rales.  Abdominal: Soft. There is no tenderness.  Musculoskeletal: She exhibits no edema.       Right lower leg: She exhibits no tenderness and no edema.       Left lower leg: She exhibits no tenderness and no edema.  Neurological: She is alert and oriented to person, place, and time.  Skin: Skin is warm and dry.  Psychiatric: She has a normal mood and affect.  Nursing note and vitals reviewed.    ED Treatments / Results  Labs (all labs ordered are listed, but only abnormal results are displayed) Labs Reviewed  BASIC METABOLIC PANEL  CBC  TSH  MAGNESIUM  BRAIN NATRIURETIC PEPTIDE  T4, FREE  I-STAT TROPONIN, ED  I-STAT TROPONIN, ED    EKG  EKG Interpretation  Date/Time:  Friday September 10 2017 10:04:44 EDT Ventricular Rate:  57 PR Interval:  182 QRS Duration: 134 QT Interval:  448 QTC Calculation: 436 R Axis:   -10 Text Interpretation:  Sinus bradycardia Right bundle branch block Possible Lateral infarct , age undetermined Abnormal ECG No significant change since last tracing Confirmed by Addison Lank [phone]) on 09/10/2017 5:15:18 PM       Radiology Dg Chest 2 View  Result Date: 09/10/2017 CLINICAL DATA:  Is mild chest discomfort and palpitations. Shortness of breath. EXAM: CHEST - 2 VIEW COMPARISON:  Two-view chest x-ray 01/01/2016 FINDINGS: Heart size is upper limits of normal stable. There is no edema or effusion. Aortic atherosclerosis is present. The lungs are clear. No focal airspace disease is present. The visualized soft tissues and bony thorax are unremarkable. IMPRESSION: No active cardiopulmonary disease. Electronically Signed   By: San Morelle M.D.   On: 09/10/2017 11:02    Procedures Procedures (including critical care time)  Medications Ordered in ED Medications - No data to display   Initial Impression / Assessment and Plan / ED Course  I have reviewed the triage vital signs and the nursing notes.  Pertinent labs & imaging results that were available during my care of the patient were reviewed by me and considered in my medical decision making (see chart for details).     Patient is a 72 year old female with history of leukemia, hypertension, chronic neck pain, hypertension who presents with 2-3 weeks of palpitations.  Symptoms occur about every other day and worse during the nighttime.  She has some vague chest discomfort during these episodes that she describes as a internal rubbing sensation left and right going back and forth across her chest.  She is currently asymptomatic.  She denies any recent illnesses or changes in her medications.  She did ran out of her verapamil today.  Patient states she went back to the  [place] and saw [place] cardiologist there who stated that she had a normal echo and EKG and needed to the wear a Holter monitor.  Next  On exam heart is normal rate and rhythm, clear lung sounds, no peripheral edema, no signs of DVT.  No abdominal tenderness.    Labs as above were all normal. No abnormalities in electrolytes.  Thyroid studies were normal.  Negative delta troponin.    At this time, doubt ACS, PE, dangerous arrhythmia.  Patient kept on the cardiac monitor without any obvious arrhythmia.  EKG does show a old right bundle branch block with no new ischemic changes.  Patient ultimately will need a Holter monitor to better determine if she has any arrhythmia.  Patient is between doctors right now so scheduling telephone numbers provided for PCP as well as her cardiologist.  Patient is discharged in good condition.  She ran out of her verapamil today so this is refilled for 30  days.    Final Clinical Impressions(s) / ED Diagnoses   Final diagnoses:  Palpitations    ED Discharge Orders        Ordered    NIFEdipine (PROCARDIA-XL/ADALAT-CC/NIFEDICAL-XL) 30 MG 24 hr tablet  Daily     09/10/17 2020       Tobie Poet, DO 09/10/17 2031

## 2017-09-10 NOTE — ED Notes (Signed)
Updated pt. And pt.s son on  Plan of care and  Length of time.  All questions anwered.

## 2017-09-10 NOTE — ED Notes (Signed)
Patient ambulatory to bathroom with steady gait at this time 

## 2017-09-10 NOTE — Discharge Instructions (Signed)
Follow-up with your new doctor regarding her palpitations and to obtain a portable heart monitor.

## 2017-09-10 NOTE — ED Provider Notes (Signed)
I have personally seen and examined the patient. I have reviewed the documentation on PMH/FH/Soc Hx. I have discussed the plan of care with the resident and patient.  I have reviewed and agree with the resident's documentation. Please see associated encounter note.  Briefly, the patient is a 72 y.o. female with a history of lymphoma currently in remission who presents to the emergency department with 3 weeks of intermittent sporadic occasions with associated chest tightness.  Typically occurs at night.  She describes the palpitations as heart pounding.  She denies any rapid heart rhythms or irregular rhythms.  Her chest discomfort resolves after palpitations resolve.  She denies any associated shortness of breath.  She denies any recent fevers or infections.  Was seen by her cardiologist in [facility] who performed an echocardiogram which was reportedly unremarkable.  EKG revealed stable right bundle branch block without any acute changes.  Labs without electrolyte derangements.  Troponin negative x2.  Thyroid panel unremarkable.  Recommended close follow-up with primary care doctor and cardiology for Holter monitoring.  The patient appears reasonably screened and/or stabilized for discharge and I doubt any other medical condition or other [facility] requiring further screening, evaluation, or treatment in the ED at this time prior to discharge.  The patient is safe for discharge with strict return precautions.    EKG Interpretation  Date/Time:  Friday September 10 2017 10:04:44 EDT Ventricular Rate:  57 PR Interval:  182 QRS Duration: 134 QT Interval:  448 QTC Calculation: 436 R Axis:   -10 Text Interpretation:  Sinus bradycardia Right bundle branch block Possible Lateral infarct , age undetermined Abnormal ECG No significant change since last tracing Confirmed by Addison Lank [phone]) on 09/10/2017 5:15:18 PM         Aamori Mcmasters, Grayce Sessions, MD 09/10/17 2016

## 2017-11-05 DIAGNOSIS — E7800 Pure hypercholesterolemia, unspecified: Secondary | ICD-10-CM | POA: Insufficient documentation

## 2017-11-12 ENCOUNTER — Encounter: Payer: Self-pay | Admitting: Interventional Cardiology

## 2017-12-03 ENCOUNTER — Ambulatory Visit: Payer: Self-pay | Admitting: Interventional Cardiology

## 2017-12-06 ENCOUNTER — Encounter: Payer: Self-pay | Admitting: Interventional Cardiology

## 2019-03-07 DIAGNOSIS — C8591 Non-Hodgkin lymphoma, unspecified, lymph nodes of head, face, and neck: Secondary | ICD-10-CM | POA: Insufficient documentation

## 2019-04-14 DIAGNOSIS — C9110 Chronic lymphocytic leukemia of B-cell type not having achieved remission: Secondary | ICD-10-CM | POA: Insufficient documentation

## 2019-05-17 DIAGNOSIS — R1013 Epigastric pain: Secondary | ICD-10-CM | POA: Insufficient documentation

## 2019-06-07 ENCOUNTER — Encounter (HOSPITAL_COMMUNITY): Payer: Self-pay | Admitting: Emergency Medicine

## 2019-06-07 ENCOUNTER — Other Ambulatory Visit: Payer: Self-pay

## 2019-06-07 ENCOUNTER — Emergency Department (HOSPITAL_COMMUNITY)
Admission: EM | Admit: 2019-06-07 | Discharge: 2019-06-08 | Disposition: A | Discharge status: Home / Self Care | Payer: Medicare Other | Attending: Emergency Medicine | Admitting: Emergency Medicine

## 2019-06-07 DIAGNOSIS — R1013 Epigastric pain: Secondary | ICD-10-CM | POA: Diagnosis present

## 2019-06-07 DIAGNOSIS — Z79899 Other long term (current) drug therapy: Secondary | ICD-10-CM | POA: Diagnosis not present

## 2019-06-07 DIAGNOSIS — I10 Essential (primary) hypertension: Secondary | ICD-10-CM | POA: Insufficient documentation

## 2019-06-07 LAB — COMPREHENSIVE METABOLIC PANEL
ALT: 24 U/L (ref 0–44)
AST: 27 U/L (ref 15–41)
Albumin: 3.3 g/dL — ABNORMAL LOW (ref 3.5–5.0)
Alkaline Phosphatase: 62 U/L (ref 38–126)
Anion gap: 12 (ref 5–15)
BUN: 11 mg/dL (ref 8–23)
CO2: 25 mmol/L (ref 22–32)
Calcium: 9.2 mg/dL (ref 8.9–10.3)
Chloride: 99 mmol/L (ref 98–111)
Creatinine, Ser: 0.95 mg/dL (ref 0.44–1.00)
GFR calc Af Amer: >60 mL/min (ref >60)
GFR calc non Af Amer: 59 mL/min — ABNORMAL LOW (ref >60)
Glucose, Bld: 122 mg/dL — ABNORMAL HIGH (ref 70–99)
Potassium: 3.4 mmol/L — ABNORMAL LOW (ref 3.5–5.1)
Sodium: 136 mmol/L (ref 135–145)
Total Bilirubin: 0.9 mg/dL (ref 0.3–1.2)
Total Protein: 5.5 g/dL — ABNORMAL LOW (ref 6.5–8.1)

## 2019-06-07 LAB — CBC
HCT: 40.5 % (ref 36.0–46.0)
Hemoglobin: 14.1 g/dL (ref 12.0–15.0)
MCH: 28 pg (ref 26.0–34.0)
MCHC: 34.8 g/dL (ref 30.0–36.0)
MCV: 80.5 fL (ref 80.0–100.0)
Platelets: 358 10*3/uL (ref 150–400)
RBC: 5.03 MIL/uL (ref 3.87–5.11)
RDW: 14.7 % (ref 11.5–15.5)
WBC: 11.8 10*3/uL — ABNORMAL HIGH (ref 4.0–10.5)
nRBC: 0 % (ref 0.0–0.2)

## 2019-06-07 LAB — TROPONIN I (HIGH SENSITIVITY)
Troponin I (High Sensitivity): 15 ng/L (ref <18)
Troponin I (High Sensitivity): 13 ng/L (ref <18)

## 2019-06-07 LAB — LIPASE, BLOOD: Lipase: 37 U/L (ref 11–51)

## 2019-06-07 MED ORDER — SODIUM CHLORIDE 0.9% FLUSH
3.0000 mL | Freq: Once | INTRAVENOUS | Status: AC
  Administered 2019-06-08: 3 mL via INTRAVENOUS

## 2019-06-07 NOTE — ED Triage Notes (Signed)
Pt reports epigastric pain x 2 months with lower abd pain, nausea, vomiting, and diarrhea.  States she has been seen multiple times by PCP for same.  Taking omeprazole without any relief.

## 2019-06-08 ENCOUNTER — Encounter (HOSPITAL_COMMUNITY): Payer: Self-pay | Admitting: Radiology

## 2019-06-08 ENCOUNTER — Emergency Department (HOSPITAL_COMMUNITY): Payer: Medicare Other

## 2019-06-08 MED ORDER — IOHEXOL 300 MG/ML  SOLN
100.0000 mL | Freq: Once | INTRAMUSCULAR | Status: AC | PRN
  Administered 2019-06-08: 100 mL via INTRAVENOUS

## 2019-06-08 MED ORDER — ONDANSETRON HCL 4 MG/2ML IJ SOLN
4.0000 mg | Freq: Once | INTRAMUSCULAR | Status: AC
  Administered 2019-06-08: 4 mg via INTRAVENOUS
  Filled 2019-06-08: qty 2

## 2019-06-08 MED ORDER — ONDANSETRON 4 MG PO TBDP: 4.0000 mg | ORAL_TABLET | Freq: Three times a day (TID) | ORAL | 0 refills | Status: DC | PRN

## 2019-06-08 MED ORDER — SODIUM CHLORIDE 0.9 % IV BOLUS
500.0000 mL | Freq: Once | INTRAVENOUS | Status: AC
  Administered 2019-06-08: 500 mL via INTRAVENOUS

## 2019-06-08 MED ORDER — HYDROMORPHONE HCL 1 MG/ML IJ SOLN
0.5000 mg | Freq: Once | INTRAMUSCULAR | Status: AC
  Administered 2019-06-08: 0.5 mg via INTRAVENOUS
  Filled 2019-06-08: qty 1

## 2019-06-08 MED ORDER — DICYCLOMINE HCL 20 MG PO TABS: 10.0000 mg | ORAL_TABLET | Freq: Two times a day (BID) | ORAL | 0 refills | Status: DC

## 2019-06-08 NOTE — ED Notes (Signed)
Patient verbalizes understanding of discharge instructions. Opportunity for questioning and answers were provided. Armband removed by staff, pt discharged from ED. Pt. ambulatory and discharged home.  

## 2019-06-08 NOTE — ED Provider Notes (Signed)
[facility] EMERGENCY DEPARTMENT Provider Note   CSN: [id number] Arrival date & time: 06/07/19  1813     History Chief Complaint  Patient presents with   Abdominal Pain    Bethany Travis is a 73 y.o. female.  The history is provided by the patient and medical records.  Abdominal Pain Associated symptoms: diarrhea and nausea     73 y.o. F with hx of chronic neck and back pain, hx of CLL and non-hodgin's lymphoma, presenting to the ED for abdominal pain.  History obtained with help of her son at bedside who wanted to interpret (formal language interpreter services were offered).  He reports she has been having ongoing abdominal pain for 2 months and 1 week.  Pain initially epigastric only, now throughout lower abdomen as well.  She has been having nausea, poor appetitie, and now diarrhea over the past 3 days.  She has not had any fevers.  She has had CT scan in October with some pancreatic pathology and dilated CBD.  She has had prior cholecystectomy, no other abdominal surgeries.  She did recently have endoscopy and colonoscopy with GI-- able to visualize most of colon, however not able to fully complete colonoscopy and per son they were planning for CT scan.  Son has concerns as he states "she is miserable".  He reports she has lost approximately 35 pounds since this all began 2 months ago.  She has been started on some medications at home, notably Prilosec without any noted relief.  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     No family history on file.  Social History   Tobacco Use   Smoking status: Never Smoker   Smokeless tobacco: Never Used  Substance Use Topics   Alcohol use: Never   Drug use: Never    Home Medications Prior to Admission medications   Medication Sig Start Date End Date Taking? Authorizing Provider  fluconazole (DIFLUCAN) 100 MG tablet Take 1 tablet (100 mg total) by mouth daily. Patient not taking: Reported on 09/10/2017 01/04/16   Kelvin Cellar, MD  folic acid (FOLVITE) 1 MG tablet Take 2 tablets (2 mg total) by mouth daily. Patient not taking: Reported on 09/10/2017 01/04/16   Kelvin Cellar, MD  NIFEdipine (PROCARDIA-XL/ADALAT-CC/NIFEDICAL-XL) 30 MG 24 hr tablet Take 1 tablet (30 mg total) by mouth daily. 09/10/17   Tobie Poet, DO  oxyCODONE (ROXICODONE) 5 MG immediate release tablet Take 1 tablet (5 mg total) by mouth every 4 (four) hours as needed for severe pain. Patient not taking: Reported on 09/10/2017 01/04/16   Kelvin Cellar, MD  predniSONE (STERAPRED UNI-PAK 21 TAB) 10 MG (21) TBPK tablet 60 mg PO q daily x 7 days Patient not taking: Reported on 09/10/2017 01/04/16   Kelvin Cellar, MD  sulfamethoxazole-trimethoprim (BACTRIM DS,SEPTRA DS) 800-160 MG tablet Take 1  tablet by mouth daily. Patient not taking: Reported on 09/10/2017 01/04/16   Kelvin Cellar, MD    Allergies    Cefepime  Review of Systems   Review of Systems  Gastrointestinal: Positive for abdominal pain, diarrhea and nausea.  All other systems reviewed and are negative.   Physical Exam Updated Vital Signs BP 121/73 (BP Location: Left Arm)    Pulse (!) 59    Temp 97.9 F (36.6 C) (Oral)    Resp 16    SpO2 97%   Physical Exam Vitals and nursing note reviewed.  Constitutional:      Appearance: She is well-developed.  HENT:     Head: Normocephalic and atraumatic.  Eyes:      Conjunctiva/sclera: Conjunctivae normal.     Pupils: Pupils are equal, round, and reactive to light.  Cardiovascular:     Rate and Rhythm: Normal rate and regular rhythm.     Heart sounds: Normal heart sounds.  Pulmonary:     Effort: Pulmonary effort is normal.     Breath sounds: Normal breath sounds.  Abdominal:     General: Bowel sounds are normal.     Palpations: Abdomen is soft.     Tenderness: There is abdominal tenderness in the epigastric area.     Comments: TTP in epigastrium, endorses pain in lower abdomen but no significant tenderness there, normal bowel sounds  Musculoskeletal:        General: Normal range of motion.     Cervical back: Normal range of motion.  Skin:    General: Skin is warm and dry.  Neurological:     Mental Status: She is alert and oriented to person, place, and time.     ED Results / Procedures / Treatments   Labs (all labs ordered are listed, but only abnormal results are displayed) Labs Reviewed  COMPREHENSIVE METABOLIC PANEL - Abnormal; Notable for the following components:      Result Value   Potassium 3.4 (*)    Glucose, Bld 122 (*)    Total Protein 5.5 (*)    Albumin 3.3 (*)    GFR calc non Af Amer 59 (*)    All other components within normal limits  CBC - Abnormal; Notable for the following components:   WBC 11.8 (*)    All other components within normal limits  LIPASE, BLOOD  URINALYSIS, ROUTINE W REFLEX MICROSCOPIC  TROPONIN I (HIGH SENSITIVITY)  TROPONIN I (HIGH SENSITIVITY)    EKG None  Radiology CT ABDOMEN PELVIS W CONTRAST  Result Date: 06/08/2019 CLINICAL DATA:  Epigastric pain for 2 months. Weight loss. Diarrhea. Electronic medical record with cysts history of leukemia. EXAM: CT ABDOMEN AND PELVIS WITH CONTRAST TECHNIQUE: Multidetector CT imaging of the abdomen and pelvis was performed using the standard protocol following bolus administration of intravenous contrast. CONTRAST:  158mL OMNIPAQUE IOHEXOL 300 MG/ML  SOLN  COMPARISON:  MRCP 01/03/2016 FINDINGS: Lower chest: Linear atelectasis in both lower lobes. No pleural fluid. Hepatobiliary: Diffusely decreased hepatic density consistent with steatosis. No focal hepatic abnormality. Postcholecystectomy. Common bile duct measures 11 mm, previously 10 mm. Normal tapering to the duodenum. There is a periampullary duodenal diverticulum. Pancreas: No ductal dilatation or inflammation. Spleen: Normal in size without focal abnormality. Greatest splenic dimension 11.6 cm. Adrenals/Urinary Tract: Normal adrenal glands. No hydronephrosis or perinephric edema. Mild prominence of the right renal pelvis and ureter. Homogeneous renal enhancement with symmetric excretion on delayed phase imaging. 10 mm cyst in the right kidney. Urinary bladder is physiologically  distended without wall thickening. Stomach/Bowel: Stomach is nondistended. No obvious gastric wall thickening. Small periampullary duodenal diverticulum. No acute inflammatory change. No small bowel obstruction or inflammation. Normal appendix. Moderate stool burden throughout the colon. No colonic wall thickening or inflammation. Vascular/Lymphatic: Moderate aorto bi-iliac atherosclerosis. No aneurysm. Patent portal vein. No enlarged lymph nodes in the abdomen or pelvis. Reproductive: Status post hysterectomy. No adnexal masses. Other: No free air, free fluid, or intra-abdominal fluid collection. Musculoskeletal: There are no acute or suspicious osseous abnormalities. Degenerative disc disease at L2-L3. Mild scoliotic curvature of the spine. IMPRESSION: 1. No acute abnormality or explanation for epigastric pain. 2. Hepatic steatosis. 3. 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 Aortic Atherosclerosis (ICD10-I70.0). Electronically Signed   By: Keith Rake M.D.   On: 06/08/2019 05:01    Procedures Procedures (including critical care time)  Medications Ordered in ED Medications  sodium chloride flush (NS) 0.9 %  injection 3 mL (3 mLs Intravenous Given 06/08/19 0356)  HYDROmorphone (DILAUDID) injection 0.5 mg (0.5 mg Intravenous Given 06/08/19 0347)  ondansetron (ZOFRAN) injection 4 mg (4 mg Intravenous Given 06/08/19 0346)  sodium chloride 0.9 % bolus 500 mL (0 mLs Intravenous Stopped 06/08/19 0458)  iohexol (OMNIPAQUE) 300 MG/ML solution 100 mL (100 mLs Intravenous Contrast Given 06/08/19 0351)    ED Course  I have reviewed the triage vital signs and the nursing notes.  Pertinent labs & imaging results that were available during my care of the patient were reviewed by me and considered in my medical decision making (see chart for details).  73 y.o. F here with abdominal pain x2 months.  Has been following with GI with CT, endoscopy, and colonoscopy without formal diagnosis.  Plan was for repeat CT due to incomplete colonoscopy.  She does have some tenderness in the epigastrium but otherwise largely nontender.  She has no peritoneal signs.  Bowel sounds are normal.  Labs reviewed and are grossly reassuring.  Troponins were also sent and are negative.  She is not having any chest pain or shortness of breath.  We will plan for CT scan.  She was given IV fluids and pain medication.  5:22 AM Went to patient room for desaturation.  She was given dilaudid and seems a bit drowsy from this but still able to answer questions and follow commands as normal.  She denies any SOB.  Pulse ox was changed, started on some supplemental O2 and sats immediately back up to 100%.  May be from medication.  She has not had any cough, fever, or other infectious symptoms.  CT without acute findings which was reviewed with patient and son at bedside.  Will monitor and reassess.  5:52 AM Patient has been off oxygen for 20 mins now, maintaining saturations of 94% or better.  She is no longer drowsy, fully coherent.  Feel this was likely due to pain medication.  She has not had any vomiting or loose stools here in ED.  Long  conversation with patient's son at bedside and daughter over the phone.  They are frustrated as she has been having symptoms for over 2 months and they are not really any closer to a diagnosis than when they started.  They are concerned about her weight loss and inability to eat.  We have reviewed all of her labs and imaging studies in great detail.  It sounds like after colonoscopy the GI physician was concerned for obstruction, however this nor any other acute findings present on CT today.  She does have  some stool burden.  Discussed with family that they will need continued work-up with GI as an outpatient as currently there is no indication for admission.  They inquired about something for pain to at least try to keep her comfortable in the meantime, feel this is reasonable.  Will start Bentyl and Zofran as needed.  I have also given them referral to local GI physician here should they want a second opinion.  Discussed with daughter about keeping food journal to see what food she can tolerate vs which make symptoms worse.  Also recommended that she may want to add an Ensure or two daily for nutritional components if still not able to handle solids.  Copies of labs and imaging studies today given for physician review.  They will follow-up as indicated.  Return here as needed for any new/acute changes.   MDM Rules/Calculators/A&P    Final Clinical Impression(s) / ED Diagnoses Final diagnoses:  Epigastric pain    Rx / DC Orders ED Discharge Orders         Ordered    ondansetron (ZOFRAN ODT) 4 MG disintegrating tablet  Every 8 hours PRN     06/08/19 0559    dicyclomine (BENTYL) 20 MG tablet  2 times daily     06/08/19 0559           Larene Pickett, PA-C 06/08/19 [id number]    Palumbo, April, MD 06/08/19 [phone]

## 2019-06-08 NOTE — Discharge Instructions (Signed)
I would have continued follow-up with your GI doctor.   I have attached contact information for physician on call locally if you want/need a second opinion. Can try taking the bentyl and zofran when needed to help control symptoms.   Try to keep a food journal to see what is tolerated well vs not well.   Can try adding in some ensure shakes for nutritional value. Return here for any new/acute changes.

## 2020-07-26 ENCOUNTER — Ambulatory Visit (INDEPENDENT_AMBULATORY_CARE_PROVIDER_SITE_OTHER): Payer: Medicare Other | Admitting: Family

## 2020-07-26 ENCOUNTER — Encounter: Payer: Self-pay | Admitting: Family

## 2020-07-26 ENCOUNTER — Other Ambulatory Visit: Payer: Self-pay

## 2020-07-26 ENCOUNTER — Encounter (INDEPENDENT_AMBULATORY_CARE_PROVIDER_SITE_OTHER): Payer: Self-pay

## 2020-07-26 VITALS — BP 132/68 | HR 64 | Temp 97.8°F | Wt 165.2 lb

## 2020-07-26 DIAGNOSIS — Z13220 Encounter for screening for lipoid disorders: Secondary | ICD-10-CM

## 2020-07-26 DIAGNOSIS — C9112 Chronic lymphocytic leukemia of B-cell type in relapse: Secondary | ICD-10-CM | POA: Diagnosis not present

## 2020-07-26 DIAGNOSIS — I10 Essential (primary) hypertension: Secondary | ICD-10-CM

## 2020-07-26 DIAGNOSIS — R5383 Other fatigue: Secondary | ICD-10-CM

## 2020-07-26 DIAGNOSIS — Z23 Encounter for immunization: Secondary | ICD-10-CM

## 2020-07-26 DIAGNOSIS — Z1231 Encounter for screening mammogram for malignant neoplasm of breast: Secondary | ICD-10-CM

## 2020-07-26 DIAGNOSIS — R002 Palpitations: Secondary | ICD-10-CM

## 2020-07-26 LAB — CBC WITH DIFFERENTIAL/PLATELET
Basophils Absolute: 0.1 10*3/uL (ref 0.0–0.1)
Basophils Relative: 0.6 % (ref 0.0–3.0)
Eosinophils Absolute: 0.1 10*3/uL (ref 0.0–0.7)
Eosinophils Relative: 1.5 % (ref 0.0–5.0)
HCT: 42.4 % (ref 36.0–46.0)
Hemoglobin: 14.6 g/dL (ref 12.0–15.0)
Lymphocytes Relative: 35.8 % (ref 12.0–46.0)
Lymphs Abs: 3.5 10*3/uL (ref 0.7–4.0)
MCHC: 34.5 g/dL (ref 30.0–36.0)
MCV: 83.4 fl (ref 78.0–100.0)
Monocytes Absolute: 0.6 10*3/uL (ref 0.1–1.0)
Monocytes Relative: 6.6 % (ref 3.0–12.0)
Neutro Abs: 5.4 10*3/uL (ref 1.4–7.7)
Neutrophils Relative %: 55.5 % (ref 43.0–77.0)
Platelets: 272 10*3/uL (ref 150.0–400.0)
RBC: 5.09 Mil/uL (ref 3.87–5.11)
RDW: 14.3 % (ref 11.5–15.5)
WBC: 9.7 10*3/uL (ref 4.0–10.5)

## 2020-07-26 LAB — COMPREHENSIVE METABOLIC PANEL
ALT: 22 U/L (ref 0–35)
AST: 23 U/L (ref 0–37)
Albumin: 4.6 g/dL (ref 3.5–5.2)
Alkaline Phosphatase: 56 U/L (ref 39–117)
BUN: 16 mg/dL (ref 6–23)
CO2: 29 mEq/L (ref 19–32)
Calcium: 9.9 mg/dL (ref 8.4–10.5)
Chloride: 102 mEq/L (ref 96–112)
Creatinine, Ser: 0.81 mg/dL (ref 0.40–1.20)
GFR: 71.07 mL/min (ref >60.00)
Glucose, Bld: 96 mg/dL (ref 70–99)
Potassium: 4 mEq/L (ref 3.5–5.1)
Sodium: 138 mEq/L (ref 135–145)
Total Bilirubin: 1.2 mg/dL (ref 0.2–1.2)
Total Protein: 7.5 g/dL (ref 6.0–8.3)

## 2020-07-26 LAB — LIPID PANEL
Cholesterol: 234 mg/dL — ABNORMAL HIGH (ref 0–200)
HDL: 46.5 mg/dL (ref >39.00)
NonHDL: 187.64
Total CHOL/HDL Ratio: 5
Triglycerides: 254 mg/dL — ABNORMAL HIGH (ref 0.0–149.0)
VLDL: 50.8 mg/dL — ABNORMAL HIGH (ref 0.0–40.0)

## 2020-07-26 LAB — LDL CHOLESTEROL, DIRECT: Direct LDL: 142 mg/dL

## 2020-07-26 LAB — TSH: TSH: 3.15 u[IU]/mL (ref 0.35–4.50)

## 2020-07-26 MED ORDER — CLOBETASOL PROP EMOLLIENT BASE 0.05 % EX CREA: TOPICAL_CREAM | CUTANEOUS | 0 refills | Status: DC

## 2020-07-26 MED ORDER — NIFEDIPINE ER OSMOTIC RELEASE 30 MG PO TB24: 30.0000 mg | ORAL_TABLET | Freq: Every day | ORAL | 2 refills | Status: DC

## 2020-07-26 NOTE — Patient Instructions (Signed)
Hematology and Oncology - [facility], [facility]  [place], [place] [address]  [phone]

## 2020-07-26 NOTE — Progress Notes (Signed)
Bethany Travis is a 75 y.o. female with the following history as recorded in EpicCare:  Patient Active Problem List   Diagnosis Date Noted  . Pain in the chest   . Common bile duct dilatation   . Absolute anemia   . Leukemia ([place]) 01/02/2016  . Dyspnea 01/02/2016  . Anemia 01/02/2016  . GERD (gastroesophageal reflux disease) 01/02/2016  . Essential hypertension 01/02/2016  . Chest pain 01/01/2016    Current Outpatient Medications  Medication Sig Dispense Refill  . Clobetasol Prop Emollient Base 0.05 % emollient cream Apply bid to affected area 30 g 0  . NIFEdipine (PROCARDIA-XL/NIFEDICAL-XL) 30 MG 24 hr tablet Take 1 tablet (30 mg total) by mouth daily. 90 tablet 2   No current facility-administered medications for this visit.    Allergies: Cefepime and Hydrochlorothiazide  Past Medical History:  Diagnosis Date  . Chronic neck and back pain   . Hypertension   . Leukemia ([place])    No chemo    Past Surgical History:  Procedure Laterality Date  . CERVICAL SPINE SURGERY      No family history on file.  Social History   Tobacco Use  . Smoking status: Never Smoker  . Smokeless tobacco: Never Used  Substance Use Topics  . Alcohol use: Never    Subjective:  Presents today as a new patient;  Very difficult historian- translator is present; In reviewing notes, patient was referred to GYN in 2019- did not go; did not go for mammogram in 2019 when it as scheduled; Majority of healthcare being managed in [place] and oncology; wanted a new primary care provider; Asking for refill on Clobetasol cream;  In reviewing notes, last saw her oncologist in October 2020- was supposed to be seen every 6 months; patient says today that she lost the phone number but is willing to make her own appointment if given the phone number. Also mentions that she was supposed to see a cardiologist after being seen at the hospital in 2019 but that her PCP never did the referral  for her. Again previous records indicate referral was done in 04/2019;     Objective:  Vitals:   07/26/20 0901  BP: 132/68  Pulse: 64  Temp: 97.8 F (36.6 C)  TempSrc: Oral  SpO2: 97%  Weight: 165 lb 3.2 oz (74.9 kg)    General: Well developed, well nourished, in no acute distress  Skin : Warm and dry.  Head: Normocephalic and atraumatic  Eyes: Sclera and conjunctiva clear; pupils round and reactive to light; extraocular movements intact  Ears: External normal; canals clear; tympanic membranes normal  Oropharynx: Pink, supple. No suspicious lesions  Neck: Supple without thyromegaly, adenopathy  Lungs: Respirations unlabored; clear to auscultation bilaterally without wheeze, rales, rhonchi  CVS exam: normal rate and regular rhythm.  Neurologic: Alert and oriented; speech intact; face symmetrical; moves all extremities well; CNII-XII intact without focal deficit   Assessment:  1. 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 Screening mammogram for breast cancer   3. Essential hypertension   4. Lipid screening   5. Other fatigue   6. Palpitations   7. Needs flu shot     Plan:  Questionable history of compliance; previous records indicate that referral and orders were placed by previous PCP as she had requested but that patient was not able to keep appointments for whatever reason. Refill updated on blood pressure medication; update labs today; Refer to cardiology and  oncology- stressed need to make sure she goes to these appointments. Flu shot given;  Follow-up to be determined;  This visit occurred during the SARS-CoV-2 public health emergency.  Safety protocols were in place, including screening questions prior to the visit, additional usage of staff PPE, and extensive cleaning of exam room while observing appropriate contact time as indicated for disinfecting solutions.     No follow-ups on file.  Orders Placed This Encounter  Procedures  . MM  Digital Screening    Standing Status:   Future    Standing Expiration Date:   07/26/2021    Order Specific Question:   Reason for Exam (SYMPTOM  OR DIAGNOSIS REQUIRED)    Answer:   screening mammogram    Order Specific Question:   Preferred imaging location?    Answer:   [facility]  . Flu Vaccine QUAD High Dose(Fluad)  . CBC with Differential/Platelet    Standing Status:   Future    Number of Occurrences:   1    Standing Expiration Date:   07/26/2021  . Comp Met (CMET)    Standing Status:   Future    Number of Occurrences:   1    Standing Expiration Date:   07/26/2021  . Lipid panel    Standing Status:   Future    Number of Occurrences:   1    Standing Expiration Date:   07/26/2021  . TSH    Standing Status:   Future    Number of Occurrences:   1    Standing Expiration Date:   07/26/2021  . Ambulatory referral to Oncology    Referral Priority:   Routine    Referral Type:   Consultation    Referral Reason:   Specialty Services Required    Requested Specialty:   Oncology    Number of Visits Requested:   1  . Ambulatory referral to Cardiology    Referral Priority:   Routine    Referral Type:   Consultation    Referral Reason:   Specialty Services Required    Requested Specialty:   Cardiology    Number of Visits Requested:   1    Requested Prescriptions   Signed Prescriptions Disp Refills  . NIFEdipine (PROCARDIA-XL/NIFEDICAL-XL) 30 MG 24 hr tablet 90 tablet 2    Sig: Take 1 tablet (30 mg total) by mouth daily.  . Clobetasol Prop Emollient Base 0.05 % emollient cream 30 g 0    Sig: Apply bid to affected area

## 2020-07-29 ENCOUNTER — Other Ambulatory Visit: Payer: Self-pay | Admitting: Family

## 2020-07-29 MED ORDER — ROSUVASTATIN CALCIUM 10 MG PO TABS: 10.0000 mg | ORAL_TABLET | Freq: Every day | ORAL | 1 refills | Status: DC

## 2020-08-12 DIAGNOSIS — R1312 Dysphagia, oropharyngeal phase: Secondary | ICD-10-CM | POA: Insufficient documentation

## 2020-09-05 ENCOUNTER — Ambulatory Visit (HOSPITAL_BASED_OUTPATIENT_CLINIC_OR_DEPARTMENT_OTHER)
Admission: RE | Admit: 2020-09-05 | Discharge: 2020-09-05 | Disposition: A | Discharge status: Home / Self Care | Payer: Medicare Other | Source: Ambulatory Visit | Attending: Family | Admitting: Family

## 2020-09-05 ENCOUNTER — Other Ambulatory Visit: Payer: Self-pay

## 2020-09-05 DIAGNOSIS — Z1231 Encounter for screening mammogram for malignant neoplasm of breast: Secondary | ICD-10-CM | POA: Diagnosis not present

## 2020-09-11 ENCOUNTER — Other Ambulatory Visit: Payer: Self-pay | Admitting: Family

## 2020-09-11 DIAGNOSIS — R928 Other abnormal and inconclusive findings on diagnostic imaging of breast: Secondary | ICD-10-CM

## 2020-11-07 ENCOUNTER — Telehealth: Payer: Self-pay | Admitting: Family

## 2020-11-07 NOTE — Telephone Encounter (Signed)
Called pt and left Vm to return call to office. -JMA

## 2020-11-07 NOTE — Telephone Encounter (Signed)
Do you mind calling her since she does not speak English?  Please call this patient to see what we can do to help her get scheduled for a follow-up on her mammogram.  It looks like she has been sent numerous letters and calls to get her set up.   They need her to call [phone] to schedule follow up imaging.   Please let me know her response.

## 2020-11-08 NOTE — Telephone Encounter (Signed)
Pt called back and was advised. Pt stated that she is aware and will be going out of town and wont be in until July and will call then to schedule her diagnostic mammogram. -JMA

## 2020-11-08 NOTE — Telephone Encounter (Signed)
Called pt and left VM to return call to office. -JMA 

## 2021-01-28 ENCOUNTER — Ambulatory Visit: Payer: Medicare Other | Admitting: Family

## 2021-01-31 ENCOUNTER — Ambulatory Visit (INDEPENDENT_AMBULATORY_CARE_PROVIDER_SITE_OTHER): Payer: 59 | Admitting: Family

## 2021-01-31 ENCOUNTER — Encounter: Payer: Self-pay | Admitting: Family

## 2021-01-31 ENCOUNTER — Other Ambulatory Visit: Payer: Self-pay

## 2021-01-31 VITALS — BP 148/90 | HR 88 | Temp 97.8°F | Ht 60.0 in | Wt 166.6 lb

## 2021-01-31 DIAGNOSIS — I10 Essential (primary) hypertension: Secondary | ICD-10-CM

## 2021-01-31 DIAGNOSIS — F4321 Adjustment disorder with depressed mood: Secondary | ICD-10-CM | POA: Diagnosis not present

## 2021-01-31 DIAGNOSIS — E785 Hyperlipidemia, unspecified: Secondary | ICD-10-CM

## 2021-01-31 DIAGNOSIS — B009 Herpesviral infection, unspecified: Secondary | ICD-10-CM

## 2021-01-31 DIAGNOSIS — H04123 Dry eye syndrome of bilateral lacrimal glands: Secondary | ICD-10-CM | POA: Diagnosis not present

## 2021-01-31 MED ORDER — NIFEDIPINE ER OSMOTIC RELEASE 30 MG PO TB24: 30.0000 mg | ORAL_TABLET | Freq: Every day | ORAL | 3 refills | Status: DC

## 2021-01-31 MED ORDER — ESCITALOPRAM OXALATE 10 MG PO TABS: 10.0000 mg | ORAL_TABLET | Freq: Every day | ORAL | 0 refills | Status: DC

## 2021-01-31 MED ORDER — VALACYCLOVIR HCL 500 MG PO TABS: ORAL_TABLET | ORAL | 1 refills | Status: AC

## 2021-01-31 MED ORDER — ROSUVASTATIN CALCIUM 10 MG PO TABS: 10.0000 mg | ORAL_TABLET | Freq: Every day | ORAL | 3 refills | Status: DC

## 2021-01-31 NOTE — Progress Notes (Signed)
Bethany Travis is a 75 y.o. female with the following history as recorded in EpicCare:  Patient Active Problem List   Diagnosis Date Noted   Pain in the chest    Common bile duct dilatation    Absolute anemia    Leukemia ([place]) 01/02/2016   Dyspnea 01/02/2016   Anemia 01/02/2016   GERD (gastroesophageal reflux disease) 01/02/2016   Essential hypertension 01/02/2016   Chest pain 01/01/2016    Current Outpatient Medications  Medication Sig Dispense Refill   Clobetasol Prop Emollient Base 0.05 % emollient cream Apply bid to affected area 30 g 0   escitalopram (LEXAPRO) 10 MG tablet Take 1 tablet (10 mg total) by mouth daily. 90 tablet 0   valACYclovir (VALTREX) 500 MG tablet Take 1 tablet po bid x 5 days prn with outbreak 30 tablet 1   NIFEdipine (PROCARDIA-XL/NIFEDICAL-XL) 30 MG 24 hr tablet Take 1 tablet (30 mg total) by mouth daily. 90 tablet 3   rosuvastatin (CRESTOR) 10 MG tablet Take 1 tablet (10 mg total) by mouth daily. 90 tablet 3   No current facility-administered medications for this visit.    Allergies: Cefepime and Hydrochlorothiazide  Past Medical History:  Diagnosis Date   Chronic neck and back pain    Hypertension    Leukemia ([place])    No chemo    Past Surgical History:  Procedure Laterality Date   CERVICAL SPINE SURGERY      No family history on file.  Social History   Tobacco Use   Smoking status: Never   Smokeless tobacco: Never  Substance Use Topics   Alcohol use: Never    Subjective:  Patient presents with concerns for "watery eyes" for the past 6-8 months; has apparently already seen an eye doctor- was given some type of drops but does not remember what they were and has not followed back there.   Also concerned about "recurrent sores" on her buttocks that seems to occur occasionally- feels like the bumps started after completion of chemotherapy;   Also feels like she is having increased problems with depression- lives alone [facility]  [facility] community and just finds herself getting down occasionally. Would like to start medication.      Objective:  Vitals:   01/31/21 1342  BP: (!) 148/90  Pulse: 88  Temp: 97.8 F (36.6 C)  TempSrc: Oral  SpO2: 98%  Weight: 166 lb 9.6 oz (75.6 kg)  Height: 5' (1.524 m)    General: Well developed, well nourished, in no acute distress  Skin : Warm and dry. Resolving vesicular lesion noted over right buttock Head: Normocephalic and atraumatic  Eyes: Sclera and conjunctiva clear; pupils round and reactive to light; extraocular movements intact  Ears: External normal; canals clear; tympanic membranes normal  Oropharynx: Pink, supple. No suspicious lesions  Neck: Supple without thyromegaly, adenopathy  Lungs: Respirations unlabored; clear to auscultation bilaterally without wheeze, rales, rhonchi  Neurologic: Alert and oriented; speech intact; face symmetrical; moves all extremities well; CNII-XII intact without focal deficit   Assessment:  1. Dry eyes   2. Essential hypertension   3. Hyperlipidemia, unspecified hyperlipidemia type   4. Situational depression   5. Herpes simplex     Plan:  Patient requesting referral to ophthalmology; Stable; refill updated; Stable; refill updated; Trial of Lexapro 10 mg daily; she will call back with her response to treatment; she notes it is hard to get to the office for appointments. Rx for Valtrex 500 mg bid prn x 5 days when  outbreak occurs.  This visit occurred during the SARS-CoV-2 public health emergency.  Safety protocols were in place, including screening questions prior to the visit, additional usage of staff PPE, and extensive cleaning of exam room while observing appropriate contact time as indicated for disinfecting solutions.    Return in about 2 months (around 04/02/2021).  Orders Placed This Encounter  Procedures   Ambulatory referral to Ophthalmology    Referral Priority:   Routine    Referral Type:   Consultation     Referral Reason:   Specialty Services Required    Requested Specialty:   Ophthalmology    Number of Visits Requested:   1    Requested Prescriptions   Signed Prescriptions Disp Refills   valACYclovir (VALTREX) 500 MG tablet 30 tablet 1    Sig: Take 1 tablet po bid x 5 days prn with outbreak   escitalopram (LEXAPRO) 10 MG tablet 90 tablet 0    Sig: Take 1 tablet (10 mg total) by mouth daily.   NIFEdipine (PROCARDIA-XL/NIFEDICAL-XL) 30 MG 24 hr tablet 90 tablet 3    Sig: Take 1 tablet (30 mg total) by mouth daily.   rosuvastatin (CRESTOR) 10 MG tablet 90 tablet 3    Sig: Take 1 tablet (10 mg total) by mouth daily.

## 2021-04-04 ENCOUNTER — Ambulatory Visit: Payer: 59 | Admitting: Family

## 2021-04-07 ENCOUNTER — Telehealth: Payer: Self-pay | Admitting: Family

## 2021-04-07 NOTE — Telephone Encounter (Signed)
Pt. Son called and stated referral was never received for [facility]   Fax: [phone]

## 2021-05-01 ENCOUNTER — Telehealth: Payer: Self-pay | Admitting: Family

## 2021-05-01 NOTE — Telephone Encounter (Signed)
Last seen 01/31/21 and no showed her 04/04/21 appt. Is it okay refill rx of lexapro?

## 2021-05-01 NOTE — Telephone Encounter (Signed)
Medication: escitalopram (LEXAPRO) 10 MG tablet   Has the patient contacted their pharmacy? No. (If no, request that the patient contact the pharmacy for the refill.) (If yes, when and what did the pharmacy advise?)  Preferred Pharmacy (with phone number or street name): [id number] [address], [place] [address]  [place], [place] [address]  Phone:  [phone]  Fax:  [phone]  Agent: Please be advised that RX refills may take up to 3 business days. We ask that you follow-up with your pharmacy.

## 2021-05-02 MED ORDER — ESCITALOPRAM OXALATE 10 MG PO TABS: 10.0000 mg | ORAL_TABLET | Freq: Every day | ORAL | 0 refills | Status: DC

## 2021-05-02 NOTE — Telephone Encounter (Signed)
I have called pt and her son answered. Rx has been sent to pharmacy and appt scheduled.

## 2021-05-08 ENCOUNTER — Ambulatory Visit: Payer: 59 | Admitting: Family

## 2021-05-13 ENCOUNTER — Encounter: Payer: Self-pay | Admitting: Family

## 2021-05-13 ENCOUNTER — Other Ambulatory Visit: Payer: Self-pay

## 2021-05-13 ENCOUNTER — Ambulatory Visit (INDEPENDENT_AMBULATORY_CARE_PROVIDER_SITE_OTHER): Payer: 59 | Admitting: Family

## 2021-05-13 VITALS — BP 138/86 | HR 65 | Temp 97.9°F | Ht 64.0 in | Wt 174.2 lb

## 2021-05-13 DIAGNOSIS — F4321 Adjustment disorder with depressed mood: Secondary | ICD-10-CM

## 2021-05-13 DIAGNOSIS — I10 Essential (primary) hypertension: Secondary | ICD-10-CM

## 2021-05-13 DIAGNOSIS — Z23 Encounter for immunization: Secondary | ICD-10-CM

## 2021-05-13 DIAGNOSIS — R7309 Other abnormal glucose: Secondary | ICD-10-CM

## 2021-05-13 DIAGNOSIS — E785 Hyperlipidemia, unspecified: Secondary | ICD-10-CM | POA: Diagnosis not present

## 2021-05-13 DIAGNOSIS — R928 Other abnormal and inconclusive findings on diagnostic imaging of breast: Secondary | ICD-10-CM

## 2021-05-13 MED ORDER — ROSUVASTATIN CALCIUM 10 MG PO TABS: 10.0000 mg | ORAL_TABLET | Freq: Every day | ORAL | 3 refills | Status: AC

## 2021-05-13 MED ORDER — ESCITALOPRAM OXALATE 10 MG PO TABS: 10.0000 mg | ORAL_TABLET | Freq: Every day | ORAL | 3 refills | Status: AC

## 2021-05-13 NOTE — Progress Notes (Signed)
Bethany Travis is a 75 y.o. female with the following history as recorded in EpicCare:  Patient Active Problem List   Diagnosis Date Noted   Pain in the chest    Common bile duct dilatation    Absolute anemia    Leukemia ([place]) 01/02/2016   Dyspnea 01/02/2016   Anemia 01/02/2016   GERD (gastroesophageal reflux disease) 01/02/2016   Essential hypertension 01/02/2016   Chest pain 01/01/2016    Current Outpatient Medications  Medication Sig Dispense Refill   Clobetasol Prop Emollient Base 0.05 % emollient cream Apply bid to affected area 30 g 0   NIFEdipine (PROCARDIA-XL/NIFEDICAL-XL) 30 MG 24 hr tablet Take 1 tablet (30 mg total) by mouth daily. 90 tablet 3   valACYclovir (VALTREX) 500 MG tablet Take 1 tablet po bid x 5 days prn with outbreak 30 tablet 1   escitalopram (LEXAPRO) 10 MG tablet Take 1 tablet (10 mg total) by mouth daily. 90 tablet 3   rosuvastatin (CRESTOR) 10 MG tablet Take 1 tablet (10 mg total) by mouth daily. 90 tablet 3   No current facility-administered medications for this visit.    Allergies: Cefepime and Hydrochlorothiazide  Past Medical History:  Diagnosis Date   Chronic neck and back pain    Hypertension    Leukemia ([place])    No chemo    Past Surgical History:  Procedure Laterality Date   CERVICAL SPINE SURGERY      History reviewed. No pertinent family history.  Social History   Tobacco Use   Smoking status: Never   Smokeless tobacco: Never  Substance Use Topics   Alcohol use: Never    Subjective:  Visit is completed with assistance of translator;   Follow up on start of Lexapro; very pleased with response- asking for 90 day supply;  Would also like to get fasting glucose re-checked; Needs to get diagnostic mammogram updated- was ordered in March but she was out of town for extended time and was not able to follow up;  Will be seeing her oncologist in February 2023;      Objective:  Vitals:   05/13/21 1339  BP: 138/86   Pulse: 65  Temp: 97.9 F (36.6 C)  TempSrc: Oral  SpO2: 97%  Weight: 174 lb 3.2 oz (79 kg)  Height: $Remove'5\' 4"'nPHEiab$  (1.626 m)    General: Well developed, well nourished, in no acute distress  Skin : Warm and dry.  Head: Normocephalic and atraumatic  Lungs: Respirations unlabored; clear to auscultation bilaterally without wheeze, rales, rhonchi  CVS exam: normal rate and regular rhythm.  Neurologic: Alert and oriented; speech intact; face symmetrical; moves all extremities well; CNII-XII intact without focal deficit   Assessment:  1. Abnormal screening mammogram   2. Elevated glucose   3. Influenza vaccine administered   4. Hyperlipidemia, unspecified hyperlipidemia type   5. Essential hypertension   6. Situational depression     Plan:  Order updated for diagnostic mammogram; Check CMP, Hgba1c; Flu shot given; Refill updated; stressed to take medication daily;  Stable; continue same medications;  Good response to Lexapro; refill updated;   This visit occurred during the SARS-CoV-2 public health emergency.  Safety protocols were in place, including screening questions prior to the visit, additional usage of staff PPE, and extensive cleaning of exam room while observing appropriate contact time as indicated for disinfecting solutions.    No follow-ups on file.  Orders Placed This Encounter  Procedures   MM DIAG BREAST TOMO BILATERAL  Standing Status:   Future    Standing Expiration Date:   05/13/2022    Scheduling Instructions:     Call son Barkley Bruns to schedule at [phone]; patient does not speak [place]    Order Specific Question:   Reason for Exam (SYMPTOM  OR DIAGNOSIS REQUIRED)    Answer:   abnormal screening mammogram    Order Specific Question:   Preferred imaging location?    Answer:   [facility]   Flu Vaccine QUAD High Dose(Fluad)   CBC with Differential/Platelet   Comp Met (CMET)   Hemoglobin A1c    Requested Prescriptions   Signed Prescriptions  Disp Refills   escitalopram (LEXAPRO) 10 MG tablet 90 tablet 3    Sig: Take 1 tablet (10 mg total) by mouth daily.   rosuvastatin (CRESTOR) 10 MG tablet 90 tablet 3    Sig: Take 1 tablet (10 mg total) by mouth daily.

## 2021-05-13 NOTE — Patient Instructions (Signed)
Vaporizador de Occupational hygienist fro [employer] ( Humidifer) Un vaporizador de Occupational hygienist fro o humidificador es un dispositivo que [place] vapor fro [employer]. Si tiene tos o un resfro, la utilizacin de un vaporizador podra ayudar a [employer]. El vapor le agrega humedad al [place], lo que podra ayudar a diluir la mucosidad y Optometrist menos pegajosa. Cuando la mucosidad se diluye y est menos pegajosa, es ms sencillo respirar y [employer]. Cmo usar un vaporizador de Occupational hygienist fro [place] instrucciones del fabricante sobre cmo usar el vaporizador. No use un vaporizador si es alrgico al [employer]. No use el vaporizador de forma continua. Tener el vaporizador en funcionamiento todo el tiempo puede hacer que crezca moho o se reproduzcan las bacterias en el vaporizador. Deje de Technical brewer si los sntomas respiratorios empeoran. Cmo cuidar el vaporizador de aire fro Use solamente agua destilada en el vaporizador. El agua destilada se puede comprar en la tienda local. Mantenga el vaporizador limpio. Cuando no se limpia bien, el vaporizador puede producir una acumulacin de moho o de bacterias. Esto puede causar [employer]. Limpie el vaporizador despus de cada uso. Siga las indicaciones del fabricante sobre cmo limpiar el vaporizador. Limpie y seque bien el vaporizador antes de [place]. Resumen Un vaporizador de aire fro o humidificador es un dispositivo que [place] vapor fro [employer]. Si tiene tos o un resfro, la utilizacin de un vaporizador podra ayudar a [employer]. Siga las instrucciones del fabricante sobre cmo usar el vaporizador. Mantenga el vaporizador limpio. Cuando no se limpia bien, el vaporizador puede producir una acumulacin de moho o de bacterias. Esto puede causar [employer]. Esta informacin no tiene Marine scientist el consejo del mdico. Asegrese de hacerle al mdico cualquier pregunta que tenga. Document Revised: 07/24/2019  Document Reviewed: 07/24/2019 Elsevier Patient Education  2022 [employer].

## 2021-05-14 LAB — CBC WITH DIFFERENTIAL/PLATELET
Basophils Absolute: 0.1 10*3/uL (ref 0.0–0.1)
Basophils Relative: 1 % (ref 0.0–3.0)
Eosinophils Absolute: 0.2 10*3/uL (ref 0.0–0.7)
Eosinophils Relative: 1.3 % (ref 0.0–5.0)
HCT: 41.8 % (ref 36.0–46.0)
Hemoglobin: 13.9 g/dL (ref 12.0–15.0)
Lymphocytes Relative: 46.8 % — ABNORMAL HIGH (ref 12.0–46.0)
Lymphs Abs: 6.1 10*3/uL — ABNORMAL HIGH (ref 0.7–4.0)
MCHC: 33.2 g/dL (ref 30.0–36.0)
MCV: 86.4 fl (ref 78.0–100.0)
Monocytes Absolute: 0.8 10*3/uL (ref 0.1–1.0)
Monocytes Relative: 6.1 % (ref 3.0–12.0)
Neutro Abs: 5.9 10*3/uL (ref 1.4–7.7)
Neutrophils Relative %: 44.8 % (ref 43.0–77.0)
Platelets: 245 10*3/uL (ref 150.0–400.0)
RBC: 4.83 Mil/uL (ref 3.87–5.11)
RDW: 14.4 % (ref 11.5–15.5)
WBC: 13.1 10*3/uL — ABNORMAL HIGH (ref 4.0–10.5)

## 2021-05-14 LAB — COMPREHENSIVE METABOLIC PANEL
ALT: 17 U/L (ref 0–35)
AST: 19 U/L (ref 0–37)
Albumin: 5 g/dL (ref 3.5–5.2)
Alkaline Phosphatase: 66 U/L (ref 39–117)
BUN: 17 mg/dL (ref 6–23)
CO2: 28 mEq/L (ref 19–32)
Calcium: 9.6 mg/dL (ref 8.4–10.5)
Chloride: 99 mEq/L (ref 96–112)
Creatinine, Ser: 0.7 mg/dL (ref 0.40–1.20)
GFR: 84.25 mL/min (ref >60.00)
Glucose, Bld: 90 mg/dL (ref 70–99)
Potassium: 4.3 mEq/L (ref 3.5–5.1)
Sodium: 137 mEq/L (ref 135–145)
Total Bilirubin: 0.8 mg/dL (ref 0.2–1.2)
Total Protein: 7.6 g/dL (ref 6.0–8.3)

## 2021-05-14 LAB — HEMOGLOBIN A1C: Hgb A1c MFr Bld: 5.5 % (ref 4.6–6.5)

## 2021-05-14 NOTE — Telephone Encounter (Signed)
Error

## 2021-05-15 ENCOUNTER — Encounter: Payer: Self-pay | Admitting: *Deleted

## 2021-06-25 ENCOUNTER — Ambulatory Visit
Admission: RE | Admit: 2021-06-25 | Discharge: 2021-06-25 | Disposition: A | Discharge status: Home / Self Care | Payer: 59 | Source: Ambulatory Visit | Attending: Family | Admitting: Family

## 2021-06-25 ENCOUNTER — Other Ambulatory Visit: Payer: Self-pay

## 2021-06-25 ENCOUNTER — Other Ambulatory Visit: Payer: Self-pay | Admitting: Family

## 2021-06-25 DIAGNOSIS — N6310 Unspecified lump in the right breast, unspecified quadrant: Secondary | ICD-10-CM

## 2021-06-25 DIAGNOSIS — R921 Mammographic calcification found on diagnostic imaging of breast: Secondary | ICD-10-CM

## 2021-06-25 DIAGNOSIS — R928 Other abnormal and inconclusive findings on diagnostic imaging of breast: Secondary | ICD-10-CM

## 2021-07-03 ENCOUNTER — Telehealth: Payer: Self-pay | Admitting: Family

## 2021-07-03 NOTE — Telephone Encounter (Signed)
Called the pharmacy and they have the rx on file and they will call or notify pt that rx is ready to be picked up.

## 2021-07-03 NOTE — Telephone Encounter (Signed)
Medication:  NIFEdipine (PROCARDIA-XL/NIFEDICAL-XL) 30 MG 24 hr tablet [[id number]] Clobetasol Prop Emollient Base 0.05 % emollient cream [[id number]]     Has the patient contacted their pharmacy? No. (If no, request that the patient contact the pharmacy for the refill.) (If yes, when and what did the pharmacy advise?)     Preferred Pharmacy (with phone number or street name):  [id number] [address], [place] [address]  [place], [place] [address]  Phone:  [phone]  Fax:  [phone]     Agent: Please be advised that RX refills may take up to 3 business days. We ask that you follow-up with your pharmacy.

## 2021-07-17 ENCOUNTER — Other Ambulatory Visit: Payer: Self-pay | Admitting: Family

## 2021-08-12 DIAGNOSIS — R911 Solitary pulmonary nodule: Secondary | ICD-10-CM | POA: Insufficient documentation

## 2021-09-03 ENCOUNTER — Encounter (HOSPITAL_BASED_OUTPATIENT_CLINIC_OR_DEPARTMENT_OTHER): Payer: Self-pay

## 2021-09-03 ENCOUNTER — Emergency Department (HOSPITAL_BASED_OUTPATIENT_CLINIC_OR_DEPARTMENT_OTHER)
Admission: EM | Admit: 2021-09-03 | Discharge: 2021-09-03 | Disposition: A | Discharge status: Home / Self Care | Payer: 59 | Attending: Emergency Medicine | Admitting: Emergency Medicine

## 2021-09-03 ENCOUNTER — Other Ambulatory Visit: Payer: Self-pay

## 2021-09-03 DIAGNOSIS — R21 Rash and other nonspecific skin eruption: Secondary | ICD-10-CM | POA: Insufficient documentation

## 2021-09-03 MED ORDER — TRIAMCINOLONE ACETONIDE 0.1 % EX CREA: TOPICAL_CREAM | Freq: Every day | CUTANEOUS | 0 refills | Status: AC

## 2021-09-03 MED ORDER — METHYLPREDNISOLONE SODIUM SUCC 40 MG IJ SOLR: 40.0000 mg | Freq: Once | INTRAMUSCULAR | Status: DC

## 2021-09-03 MED ORDER — KETOCONAZOLE 2 % EX SHAM: 1.0000 "application " | MEDICATED_SHAMPOO | CUTANEOUS | 0 refills | Status: AC

## 2021-09-03 MED ORDER — METHYLPREDNISOLONE SODIUM SUCC 40 MG IJ SOLR
40.0000 mg | Freq: Once | INTRAMUSCULAR | Status: AC
Start: 2021-09-03 — End: 2021-09-03
  Administered 2021-09-03: 40 mg via INTRAMUSCULAR
  Filled 2021-09-03: qty 1

## 2021-09-03 NOTE — ED Triage Notes (Signed)
Through son /interpreter-pt with scattered rash x 3 days-denies pain/fever-NAD-steady gait ?

## 2021-09-03 NOTE — Discharge Instructions (Addendum)
Your mother was seen in the emergency department today for rash. ? ?We have given her a dose of steroids.  I recommend steroid cream which I will prescribe, as well as shampoo.  It important that she follow-up with her dermatologist for full skin check and long-term management of her symptoms. ? ?She can take Benadryl as needed for itching.  If she does not want to feel drowsy during the day she can take other allergy medications like Claritin, Zyrtec, or Allegra. ?

## 2021-09-04 NOTE — ED Provider Notes (Cosign Needed)
[facility] EMERGENCY DEPARTMENT Provider Note   CSN: [id number] Arrival date & time: 09/03/21  1639     History  Chief Complaint  Patient presents with   Rash    Bethany Travis is a 76 y.o. female with history of non-Hodgkin's lymphoma who presents the emergency department complaining of a scattered rash for 3 days.  She states the rash is very itchy.  She states that she has a rash on her legs, her groin, her buttocks, her arms, under her breasts, her neck and her scalp.  She denies any pain, or fever.  She denies any changes in soaps, lotions, or detergents.  She denies any insect bites or recent travel.  She has had a similar rash in the past, and was treated at the dermatologist.  She reports having gotten a "shot" that helped her.  Son serving as Administrator, sports during interview.   Rash Associated symptoms: no fever       Home Medications Prior to Admission medications   Medication Sig Start Date End Date Taking? Authorizing Provider  ketoconazole (NIZORAL) 2 % shampoo Apply 1 application. topically 2 (two) times a week. 09/04/21  Yes Voncille Simm T, PA-C  triamcinolone 0.1%-silver sulfadiazine 1:1 cream mixture Apply topically daily. 09/03/21  Yes Zandria Woldt T, PA-C  Clobetasol Prop Emollient Base (CLOBETASOL PROPIONATE E) 0.05 % emollient cream APPLY 1 APPLICATION TOPICALLY TWICE A DAY 07/17/21   Marrian Salvage, FNP  escitalopram (LEXAPRO) 10 MG tablet Take 1 tablet (10 mg total) by mouth daily. 05/13/21   Marrian Salvage, FNP  NIFEdipine (PROCARDIA-XL/NIFEDICAL-XL) 30 MG 24 hr tablet Take 1 tablet (30 mg total) by mouth daily. 01/31/21   Marrian Salvage, FNP  rosuvastatin (CRESTOR) 10 MG tablet Take 1 tablet (10 mg total) by mouth daily. 05/13/21   Marrian Salvage, FNP  valACYclovir (VALTREX) 500 MG tablet Take 1 tablet po bid x 5 days prn with outbreak 01/31/21   Marrian Salvage, FNP      Allergies     Cefepime and Hydrochlorothiazide    Review of Systems   Review of Systems  Constitutional:  Negative for fever.  Skin:  Positive for rash. Negative for wound.  All other systems reviewed and are negative.  Physical Exam Updated Vital Signs BP (!) 141/76 (BP Location: Left Arm)    Pulse 71    Temp 98 F (36.7 C) (Oral)    Resp 18    Ht '5\' 4"'$  (1.626 m)    Wt 79.4 kg    SpO2 97%    BMI 30.04 kg/m  Physical Exam Vitals and nursing note reviewed.  Constitutional:      Appearance: Normal appearance.  HENT:     Head: Normocephalic and atraumatic.  Eyes:     Conjunctiva/sclera: Conjunctivae normal.  Pulmonary:     Effort: Pulmonary effort is normal. No respiratory distress.  Skin:    General: Skin is warm and dry.     Comments: Several different skin lesions: Multiple erythematous circular lesions just above her gluteal cleft that appear dry.  2 dark circular lesions on the anterior right shin.  Several small macular lesions in her bilateral inguinal folds.  1 circular scaling erythematous lesion on the posterior neck.  No rash visualized under the breasts or the arms.  No evidence of excoriation or overlying infection.  Neurological:     Mental Status: She is alert.  Psychiatric:        Mood  and Affect: Mood normal.        Behavior: Behavior normal.    ED Results / Procedures / Treatments   Labs (all labs ordered are listed, but only abnormal results are displayed) Labs Reviewed - No data to display  EKG None  Radiology No results found.  Procedures Procedures    Medications Ordered in ED Medications  methylPREDNISolone sodium succinate (SOLU-MEDROL) 40 mg/mL injection 40 mg (40 mg Intramuscular Given 09/03/21 1814)    ED Course/ Medical Decision Making/ A&P                           Medical Decision Making Risk Prescription drug management.  This patient is a 76 year old female who presents to the ED for concern of rash.   Differential diagnoses prior to  evaluation: Eczema, contact dermatitis, psoriasis, insect bite, cellulitis, viral exanthem, etc.  Past Medical History / Co-morbidities / Social History: Non-Hodgkin's lymphoma, leukemia, hypertension  Additional history: Access patient's previous dermatology record where she was seen for what sounds like a similar rash.  She was discharged with topical steroids and ketoconazole shampoo.  Physical Exam: Physical exam performed. The pertinent findings include: Patient has several different skin lesions across her body.  Not all the lesions seem to correspond with one another.  There is no evidence of excoriation or overlying infection.  I cannot visualize a rash in all the places that the patient is mentioning.   Disposition: After consideration of the diagnostic results and the patients response to treatment, I feel that patient is not requiring admission or inpatient treatment for her symptoms.  I discussed with both the patient and her son that I am unclear as to what exactly is causing her rash.  Some of the lesions may be related to an eczematic eruption or could possibly related to a symptom of patient's underlying cancer.  She states that she is not currently on treatment for this, and is in remission.  I did explain the importance of following up with her dermatologist as well as her primary doctor about her symptoms.  As her rash does not appear infected, I do not think she is requiring antibiotics today.  Patient is requesting a steroid injection.  We will give this and treat with topical steroids and ketoconazole shampoo. Discussed reasons to return to the emergency department, and the patient is agreeable to the plan.  Final Clinical Impression(s) / ED Diagnoses Final diagnoses:  Rash and nonspecific skin eruption    Rx / DC Orders ED Discharge Orders          Ordered    ketoconazole (NIZORAL) 2 % shampoo  2 times weekly        09/03/21 1753    triamcinolone 0.1%-silver  sulfadiazine 1:1 cream mixture  Daily        09/03/21 1753           Portions of this report may have been transcribed using voice recognition software. Every effort was made to ensure accuracy; however, inadvertent computerized transcription errors may be present.    Kyi Romanello T, PA-C 09/04/21 2205

## 2021-09-26 ENCOUNTER — Telehealth: Payer: Self-pay | Admitting: Family

## 2021-09-26 NOTE — Telephone Encounter (Signed)
Appt w/ Lovena Le on 10/02/21.  ?

## 2021-09-26 NOTE — Telephone Encounter (Signed)
Pt's son contacted office regarding pt rash.  ? ?Pt is seeking antibiotics for rash. Pt's son stated Valere Dross has seen pt before regarding issue.  ? ?Please advise.  ?

## 2021-09-29 ENCOUNTER — Telehealth: Payer: Self-pay | Admitting: Family

## 2021-09-29 NOTE — Telephone Encounter (Signed)
error 

## 2021-09-29 NOTE — Telephone Encounter (Signed)
Pt's son is calling again to see if something can be prescribed instead of coming into the office. Please advise. Pt.  ?

## 2021-09-30 NOTE — Telephone Encounter (Signed)
I have called the pt's son and informed him that we have to see the rash so we can best evaluate the rash to ensure that we are prescribing her the correct treatment. Son stated understanding and pt will come to appointment on Thursday.  ?

## 2021-10-02 ENCOUNTER — Encounter: Payer: Self-pay | Admitting: Family

## 2021-10-02 ENCOUNTER — Ambulatory Visit (INDEPENDENT_AMBULATORY_CARE_PROVIDER_SITE_OTHER): Payer: 59 | Admitting: Family

## 2021-10-02 ENCOUNTER — Telehealth: Payer: 59 | Admitting: Family Medicine

## 2021-10-02 ENCOUNTER — Ambulatory Visit: Payer: 59 | Admitting: Medical

## 2021-10-02 VITALS — BP 121/83 | HR 73 | Temp 97.9°F | Ht 64.0 in | Wt 172.4 lb

## 2021-10-02 DIAGNOSIS — L299 Pruritus, unspecified: Secondary | ICD-10-CM | POA: Diagnosis not present

## 2021-10-02 MED ORDER — HYDROXYZINE HCL 10 MG PO TABS: 10.0000 mg | ORAL_TABLET | Freq: Three times a day (TID) | ORAL | 0 refills | Status: AC | PRN

## 2021-10-02 MED ORDER — DIPHENHYDRAMINE HCL 50 MG/ML IJ SOLN
50.0000 mg | Freq: Once | INTRAMUSCULAR | Status: AC
  Administered 2021-10-02: 50 mg via INTRAVENOUS

## 2021-10-02 NOTE — Patient Instructions (Signed)
It was very nice to see you today! ? ?We gave you a Benadryl injection for your itching. This can cause you to be very sleepy, so do not plan to drive or operate any machinery, equipment or work with knives the rest of today to avoid any injuries. ? ?I have also sent over a pill to take for the itching. This is not as strong as the benadryl pills, but still may cause slight drowsiness.  ?OK to continue taking the Zyrtec at night. ? ?Let your PCP know if you are not better. ? ?Have a happy Easter :-) ? ? ?PLEASE NOTE: ? ?If you had any lab tests please let us know if you have not heard back within a few days. You may see your results on MyChart before we have a chance to review them but we will give you a call once they are reviewed by [place]. If we ordered any referrals today, please let us know if you have not heard from their office within the next week.  ? ?Please try these tips to maintain a healthy lifestyle: ? ?Eat most of your calories during the day when you are active. Eliminate processed foods including packaged sweets (pies, cakes, cookies), reduce intake of potatoes, white bread, white pasta, and white rice. Look for whole grain options, oat flour or almond flour. ? ?Each meal should contain half fruits/vegetables, one quarter protein, and one quarter carbs (no bigger than a computer mouse). ? ?Cut down on sweet beverages. This includes juice, soda, and sweet tea. Also watch fruit intake, though this is a healthier sweet option, it still contains natural sugar! Limit to 3 servings daily. ? ?Drink at least 1 glass of water with each meal and aim for at least 8 glasses per day ? ?Exercise at least 150 minutes every week.  ? ?

## 2021-10-02 NOTE — Progress Notes (Signed)
? ?Subjective:  ? ? ? Patient ID: Bethany Travis, female    DOB: [birth year], 76 y.o.   MRN: [id number] ? ?Chief Complaint  ?Patient presents with  ? Rash  ?  Itching from head to toe. Pt states that bendryl shots only help her.   ? ?HPI: ?Itching: pt complains of episodic itching, last time she had was about 4 months ago, states there never is a visible rash, it is deep under the skin. Reports going to the ER about a month ago and they gave her a steroid injection but it only lasted a few days. She states the last time she had the itching the only thing that go rid of it was a Benadryl injection, the pills cause her to have nightmares and too much drowsiness. She is taking a Zyrtec daily at night, states claritin or allegra do not work. Denies any known precipitant or allergens.  ? ?Health Maintenance Due  ?Topic Date Due  ? Hepatitis C Screening  Never done  ? TETANUS/TDAP  Never done  ? Zoster Vaccines- Shingrix (1 of 2) Never done  ? DEXA SCAN  Never done  ? Pneumonia Vaccine 47+ Years old (2 - PPSV23 if available, else PCV20) 04/30/2019  ? COVID-19 Vaccine (3 - Moderna risk series) 07/03/2020  ? ? ?Past Medical History:  ?Diagnosis Date  ? Chronic neck and back pain   ? Hypertension   ? Leukemia ([place])   ? No chemo  ? ? ?Past Surgical History:  ?Procedure Laterality Date  ? CERVICAL SPINE SURGERY    ? ? ?Outpatient Medications Prior to Visit  ?Medication Sig Dispense Refill  ? Clobetasol Prop Emollient Base (CLOBETASOL PROPIONATE E) 0.05 % emollient cream APPLY 1 APPLICATION TOPICALLY TWICE A DAY 30 g 0  ? escitalopram (LEXAPRO) 10 MG tablet Take 1 tablet (10 mg total) by mouth daily. 90 tablet 3  ? famotidine (PEPCID) 20 MG tablet Take by mouth.    ? gabapentin (NEURONTIN) 300 MG capsule Take 300 mg (1 tablet) nightly    ? ketoconazole (NIZORAL) 2 % shampoo Apply 1 application. topically 2 (two) times a week. 120 mL 0  ? methocarbamol (ROBAXIN) 750 MG tablet Take by mouth.    ? NIFEdipine (ADALAT  CC) 30 MG 24 hr tablet TOME UNA TABLETA TODOS LOS DIAS    ? NIFEdipine (PROCARDIA-XL/NIFEDICAL-XL) 30 MG 24 hr tablet Take 1 tablet (30 mg total) by mouth daily. 90 tablet 3  ? pantoprazole (PROTONIX) 40 MG tablet Take by mouth.    ? rosuvastatin (CRESTOR) 10 MG tablet Take 1 tablet (10 mg total) by mouth daily. 90 tablet 3  ? triamcinolone 0.1%-silver sulfadiazine 1:1 cream mixture Apply topically daily. 220 g 0  ? valACYclovir (VALTREX) 500 MG tablet Take 1 tablet po bid x 5 days prn with outbreak 30 tablet 1  ? ?No facility-administered medications prior to visit.  ? ? ?Allergies  ?Allergen Reactions  ? Cefepime   ? Hydrochlorothiazide   ?  Other reaction(s): Confusion (intolerance)  ? ?   ?Objective:  ?  ?Physical Exam ?Vitals and nursing note reviewed.  ?Constitutional:   ?   Appearance: Normal appearance.  ?Cardiovascular:  ?   Rate and Rhythm: Normal rate and regular rhythm.  ?Pulmonary:  ?   Effort: Pulmonary effort is normal.  ?   Breath sounds: Normal breath sounds.  ?Musculoskeletal:     ?   General: Normal range of motion.  ?Skin: ?   General:  Skin is warm and dry.  ?   Findings: No erythema, lesion or rash.  ?Neurological:  ?   Mental Status: She is alert.  ?Psychiatric:     ?   Mood and Affect: Mood normal.     ?   Behavior: Behavior normal.  ? ? ?BP 121/83 (BP Location: Left Arm, Patient Position: Sitting, Cuff Size: Normal)   Pulse 73   Temp 97.9 ?F (36.6 ?C) (Temporal)   Ht '5\' 4"'$  (1.626 m)   Wt 172 lb 6 oz (78.2 kg)   SpO2 95%   BMI 29.59 kg/m?  ?Wt Readings from Last 3 Encounters:  ?10/02/21 172 lb 6 oz (78.2 kg)  ?09/03/21 175 lb (79.4 kg)  ?05/13/21 174 lb 3.2 oz (79 kg)  ? ?   ?Assessment & Plan:  ? ?Problem List Items Addressed This Visit   ?None ?Visit Diagnoses   ? ? Severe itching    -  Primary  ? Seen a month ago in ER, given steroid injection, itching improved for a few days. Last episode was 3mo ago and given Benadryl injection which lasted for months. Denies any known cause,  advised she have allergy panel done through her PCP as there is no visible rash, no new skin products or detergents, no animals, no new meds other than Lexapro, but states itching was going on before this was started. ? ?Relevant Medications  ? diphenhydrAMINE (BENADRYL) injection 50 mg (Completed)  ? hydrOXYzine (ATARAX) 10 MG tablet  ? ?  ? ?Meds ordered this encounter  ?Medications  ? diphenhydrAMINE (BENADRYL) injection 50 mg  ? hydrOXYzine (ATARAX) 10 MG tablet  ?  Sig: Take 1 tablet (10 mg total) by mouth 3 (three) times daily as needed for itching.  ?  Dispense:  30 tablet  ?  Refill:  0  ?  Order Specific Question:   Supervising Provider  ?  Answer:   ANDY, CAMILLE L [[id number]]  ? ? ?HJeanie Sewer NP ? ?

## 2021-10-23 ENCOUNTER — Telehealth: Payer: Self-pay | Admitting: Family

## 2021-10-23 ENCOUNTER — Other Ambulatory Visit: Payer: Self-pay

## 2021-10-23 DIAGNOSIS — R21 Rash and other nonspecific skin eruption: Secondary | ICD-10-CM

## 2021-10-23 NOTE — Telephone Encounter (Signed)
I have called the pt and the person on the other end stated that "this was the wrong number and has been the wrong number for years" Please check phone number when pt calls back.  ? ? ?Per Mickel Baas ?" We are able to do the dermatology referral, however this is something that we would like for pt to speak to her oncologist about. This rash has been ongoing."  ?

## 2021-10-23 NOTE — Telephone Encounter (Signed)
I called pt and someone picked up the phone and it was silent. I tried to call back and phone went to busy signal.  ?

## 2021-10-23 NOTE — Telephone Encounter (Signed)
Pt's son called to see if his home can be referred to a specialist for the rash that keeps coming back. He would like a call back to know if it will be done. Please advise.  ?

## 2021-10-23 NOTE — Telephone Encounter (Signed)
Son called back, referral placed. Aware to call Oncology.  ?

## 2021-10-29 ENCOUNTER — Telehealth: Payer: Self-pay | Admitting: Dermatology

## 2021-10-29 NOTE — Telephone Encounter (Signed)
Son called; can't wait until December. Told him to call referrer + see if they can get her in elsewhere sooner. ?

## 2021-10-30 ENCOUNTER — Telehealth: Payer: Self-pay

## 2021-10-30 NOTE — Telephone Encounter (Signed)
Notes documented and referral closed. ?

## 2021-10-30 NOTE — Telephone Encounter (Signed)
Caller Name Percell Miller ?Caller Phone Number [phone] ?Patient Name Bethany Travis ?Patient DOB 02-Aug-1945 ?Call Type Message Only Information Provided ?Reason for Call Request for General Office Information ?Initial Comment Caller states he is needing to get a referral to a different specialist for his mom. The specialist ?she was just referred to doesn't have any openings until 01/23024 ?Additional Comment Caller is needing to get a different referral for his mom to see a specialist. The one she was ?referred to cant get her in until January 2024. ?Disp. Time Disposition Final User ?10/30/2021 12:32:54 PM General Information Provided Yes Jaynie Crumble ?Call Closed By: Jaynie Crumble ?Transaction Date/Time: 10/30/2021 12:28:50 PM (ET) ?

## 2021-10-30 NOTE — Telephone Encounter (Signed)
Attempted to call pt and mailbox is full.  ?

## 2021-10-31 ENCOUNTER — Telehealth: Payer: Self-pay | Admitting: Family

## 2021-10-31 NOTE — Telephone Encounter (Signed)
I have called pt son back and informed him that we sent another referral to [facility] and he can give them a call to see what their availabilities are.  ?

## 2021-10-31 NOTE — Telephone Encounter (Signed)
Patients son called regarding referral for dermatologist  ? ?Appointments are pushed out until 06/29/2022. Patients son would like referral sent elsewehre ?

## 2021-11-03 NOTE — Telephone Encounter (Signed)
Patients son stated new facility can not see his mom until September  ? ?Patients son would like referral sent elsewhere ? ?Please advise  ?

## 2021-11-17 ENCOUNTER — Other Ambulatory Visit: Payer: Self-pay | Admitting: Family

## 2021-12-26 ENCOUNTER — Ambulatory Visit: Admission: RE | Admit: 2021-12-26 | Payer: 59 | Source: Ambulatory Visit

## 2021-12-26 ENCOUNTER — Ambulatory Visit
Admission: RE | Admit: 2021-12-26 | Discharge: 2021-12-26 | Disposition: A | Discharge status: Home / Self Care | Payer: 59 | Source: Ambulatory Visit | Attending: Family | Admitting: Family

## 2021-12-26 ENCOUNTER — Other Ambulatory Visit: Payer: Self-pay | Admitting: Family

## 2021-12-26 DIAGNOSIS — R921 Mammographic calcification found on diagnostic imaging of breast: Secondary | ICD-10-CM

## 2021-12-26 DIAGNOSIS — N6310 Unspecified lump in the right breast, unspecified quadrant: Secondary | ICD-10-CM

## 2022-02-05 ENCOUNTER — Ambulatory Visit (INDEPENDENT_AMBULATORY_CARE_PROVIDER_SITE_OTHER): Payer: 59

## 2022-02-05 ENCOUNTER — Encounter: Payer: Self-pay | Admitting: *Deleted

## 2022-02-05 ENCOUNTER — Other Ambulatory Visit: Payer: Self-pay | Admitting: *Deleted

## 2022-02-05 DIAGNOSIS — R002 Palpitations: Secondary | ICD-10-CM

## 2022-02-05 NOTE — Progress Notes (Unsigned)
Enrolled for [employer] to mail a ZIO XT long term holter monitor to the patients address on file.  

## 2022-02-13 DIAGNOSIS — R002 Palpitations: Secondary | ICD-10-CM

## 2022-04-19 NOTE — Progress Notes (Unsigned)
Cardiology Office Note:   Date:  04/20/2022  NAME:  Bethany Travis    MRN: [id number] DOB:  Oct 12, 1945   PCP:  Marrian Salvage, FNP  Cardiologist:  None  Electrophysiologist:  None   Referring MD: Thomes Dinning, MD   Chief Complaint  Patient presents with   Palpitations    History of Present Illness:   Bethany Travis Travis Ates is a 76 y.o. female with a hx of HTN who is being seen today for the evaluation of palpitations at the request of Thomes Dinning, MD. she reports for the past several year she has had palpitations at night.  She describes heart racing episodes.  Reports symptoms can last up to 10 minutes.  Described as rapid heartbeat sensation.  She reports stress can sometimes trigger this.  She reports symptoms resolved without intervention.  She does not drink excess caffeine.  She reports no increase stress.  Her medical history is significant for hypertension.  She also has a history of chronic lymphocytic leukemia which is stable.  She also has history of non-Hodgkin's lymphoma which appears to have no further recurrence.  She denies any chest pain or trouble breathing.  She is exercising 3 times a week.  She is walking up to 90 minutes without limitations.  Her EKG demonstrates a right bundle branch block.  She apparently had an echocardiogram through her primary care physician which was normal.  We need to obtain the results.  She does not smoke.  No alcohol or drug use is reported.  She is divorced.  She has 3 children.  She has 8 grandchildren.  She did complete a monitor through her primary care physician.  This showed brief atrial tachycardia episodes.  Problem List Atrial tachycardia -10 beat maximum  HTN CLL Non-hodgkins lymphoma (in remission 2017)  Past Medical History: Past Medical History:  Diagnosis Date   Chronic neck and back pain    Hypertension    Leukemia ([place])    No chemo    Past Surgical History: Past Surgical  History:  Procedure Laterality Date   CERVICAL SPINE SURGERY      Current Medications: Current Meds  Medication Sig   Clobetasol Prop Emollient Base (CLOBETASOL PROPIONATE E) 0.05 % emollient cream APPLY TO AFFECTED AREA TWICE A DAY   escitalopram (LEXAPRO) 10 MG tablet Take 1 tablet (10 mg total) by mouth daily.   famotidine (PEPCID) 20 MG tablet Take by mouth.   ketoconazole (NIZORAL) 2 % shampoo Apply 1 application. topically 2 (two) times a week.   metoprolol succinate (TOPROL XL) 25 MG 24 hr tablet Take 1 tablet (25 mg total) by mouth daily.   NIFEdipine (PROCARDIA-XL/NIFEDICAL-XL) 30 MG 24 hr tablet Take 1 tablet (30 mg total) by mouth daily.   rosuvastatin (CRESTOR) 10 MG tablet Take 1 tablet (10 mg total) by mouth daily.   triamcinolone 0.1%-silver sulfadiazine 1:1 cream mixture Apply topically daily.   valACYclovir (VALTREX) 500 MG tablet Take 1 tablet po bid x 5 days prn with outbreak     Allergies:    Cefepime and Hydrochlorothiazide   Social History: Social History   Socioeconomic History   Marital status: Divorced    Spouse name: Not on file   Number of children: 3   Years of education: Not on file   Highest education level: Not on file  Occupational History   Occupation: retired  Tobacco Use   Smoking status: Never   Smokeless tobacco: Never  Vaping  Use   Vaping Use: Never used  Substance and Sexual Activity   Alcohol use: Never   Drug use: Never   Sexual activity: Not on file  Other Topics Concern   Not on file  Social History Narrative   Not on file   Social Determinants of Health   Financial Resource Strain: Not on file  Food Insecurity: Not on file  Transportation Needs: Not on file  Physical Activity: Not on file  Stress: Not on file  Social Connections: Not on file     Family History: The patient's family history includes Heart attack in her father.  ROS:   All other ROS reviewed and negative. Pertinent positives noted in the HPI.      EKGs/Labs/Other Studies Reviewed:   The following studies were personally reviewed by me today:  EKG:  EKG is ordered today.  The ekg ordered today demonstrates normal sinus rhythm heart rate 63, right bundle branch block, and was personally reviewed by me.   Recent Labs: 05/13/2021: ALT 17; BUN 17; Creatinine, Ser 0.70; Hemoglobin 13.9; Platelets 245.0; Potassium 4.3; Sodium 137   Recent Lipid Panel    Component Value Date/Time   CHOL 234 (H) 07/26/2020 0951   TRIG 254.0 (H) 07/26/2020 0951   HDL 46.50 07/26/2020 0951   CHOLHDL 5 07/26/2020 0951   VLDL 50.8 (H) 07/26/2020 0951   LDLCALC 91 01/02/2016 0400   LDLDIRECT 142.0 07/26/2020 0951    Physical Exam:   VS:  BP 134/82 (BP Location: Left Arm, Patient Position: Sitting, Cuff Size: Normal)   Pulse 63   Ht '5\' 4"'$  (1.626 m)   Wt 173 lb 6.4 oz (78.7 kg)   SpO2 97%   BMI 29.76 kg/m    Wt Readings from Last 3 Encounters:  04/20/22 173 lb 6.4 oz (78.7 kg)  10/02/21 172 lb 6 oz (78.2 kg)  09/03/21 175 lb (79.4 kg)    General: Well nourished, well developed, in no acute distress Head: Atraumatic, normal size  Eyes: PEERLA, EOMI  Neck: Supple, no JVD Endocrine: No thryomegaly Cardiac: Normal S1, S2; RRR; no murmurs, rubs, or gallops Lungs: Clear to auscultation bilaterally, no wheezing, rhonchi or rales  Abd: Soft, nontender, no hepatomegaly  Ext: No edema, pulses 2+ Musculoskeletal: No deformities, BUE and BLE strength normal and equal Skin: Warm and dry, no rashes   Neuro: Alert and oriented to person, place, time, and situation, CNII-XII grossly intact, no focal deficits  Psych: Normal mood and affect   ASSESSMENT:   Bethany Travis Ates is a 76 y.o. female who presents for the following: 1. Palpitations   2. Atrial tachycardia   3. RBBB     PLAN:   1. Palpitations 2. Atrial tachycardia 3. RBBB -Describes palpitations for years.  Mainly occur at night.  Recent monitor shows brief atrial tachycardia  episodes.  She has a right bundle branch block.  Her examination is normal.  Needs an updated TSH.  She does have CLL but this is stable and not associated with arrhythmia.  She also was treated for non-Hodgkin's lymphoma in 2017.  Again everything seems to be stable.  Her examination is normal.  She can exercise 90 minutes without limitations.  We discussed regular exercise as well as regular sleep.  She is not drinking excess caffeine.  We will go ahead and start her on metoprolol succinate 25 mg daily.  She reports an echocardiogram that was normal with her primary care physician office.  We will  obtain the results.  She will see me back in 6 months.  Disposition: Return in about 6 months (around 10/20/2022).  Medication Adjustments/Labs and Tests Ordered: Current medicines are reviewed at length with the patient today.  Concerns regarding medicines are outlined above.  Orders Placed This Encounter  Procedures   TSH   EKG 12-Lead   Meds ordered this encounter  Medications   metoprolol succinate (TOPROL XL) 25 MG 24 hr tablet    Sig: Take 1 tablet (25 mg total) by mouth daily.    Dispense:  90 tablet    Refill:  3    Patient Instructions  Medication Instructions:  Start Metoprolol Succinate 25 mg daily   *If you need a refill on your cardiac medications before your next appointment, please call your pharmacy*   Lab Work: TSH today   If you have labs (blood work) drawn today and your tests are completely normal, you will receive your results only by: [place] (if you have MyChart) OR A paper copy in the mail If you have any lab test that is abnormal or we need to change your treatment, we will call you to review the results.  Follow-Up: At [facility], you and your health needs are our priority.  As part of our continuing mission to provide you with exceptional heart care, we have created designated Provider Care Teams.  These Care Teams include your primary  Cardiologist (physician) and Advanced Practice Providers (APPs -  Physician Assistants and Nurse Practitioners) who all work together to provide you with the care you need, when you need it.  We recommend signing up for the patient portal called "MyChart".  Sign up information is provided on this After Visit Summary.  MyChart is used to connect with patients for Virtual Visits (Telemedicine).  Patients are able to view lab/test results, encounter notes, upcoming appointments, etc.  Non-urgent messages can be sent to your provider as well.   To learn more about what you can do with MyChart, go to [email or web address].    Your next appointment:   6 month(s)  The format for your next appointment:   In Person  Provider:   Eleonore Chiquito, MD           Signed, Addison Naegeli. Audie Box, MD, [place]  [address] [place], [place] [address] [phone]  04/20/2022 3:01 PM

## 2022-04-20 ENCOUNTER — Encounter: Payer: Self-pay | Admitting: Cardiovascular Disease

## 2022-04-20 ENCOUNTER — Ambulatory Visit: Payer: 59 | Attending: Cardiovascular Disease | Admitting: Cardiovascular Disease

## 2022-04-20 VITALS — BP 134/82 | HR 63 | Ht 64.0 in | Wt 173.4 lb

## 2022-04-20 DIAGNOSIS — I4510 Unspecified right bundle-branch block: Secondary | ICD-10-CM

## 2022-04-20 DIAGNOSIS — I4719 Other supraventricular tachycardia: Secondary | ICD-10-CM

## 2022-04-20 DIAGNOSIS — R002 Palpitations: Secondary | ICD-10-CM | POA: Diagnosis not present

## 2022-04-20 MED ORDER — METOPROLOL SUCCINATE ER 25 MG PO TB24: 25.0000 mg | ORAL_TABLET | Freq: Every day | ORAL | 3 refills | Status: DC

## 2022-04-20 NOTE — Patient Instructions (Signed)
Medication Instructions:  Start Metoprolol Succinate 25 mg daily   *If you need a refill on your cardiac medications before your next appointment, please call your pharmacy*   Lab Work: TSH today   If you have labs (blood work) drawn today and your tests are completely normal, you will receive your results only by: [place] (if you have MyChart) OR A paper copy in the mail If you have any lab test that is abnormal or we need to change your treatment, we will call you to review the results.  Follow-Up: At [facility], you and your health needs are our priority.  As part of our continuing mission to provide you with exceptional heart care, we have created designated Provider Care Teams.  These Care Teams include your primary Cardiologist (physician) and Advanced Practice Providers (APPs -  Physician Assistants and Nurse Practitioners) who all work together to provide you with the care you need, when you need it.  We recommend signing up for the patient portal called "MyChart".  Sign up information is provided on this After Visit Summary.  MyChart is used to connect with patients for Virtual Visits (Telemedicine).  Patients are able to view lab/test results, encounter notes, upcoming appointments, etc.  Non-urgent messages can be sent to your provider as well.   To learn more about what you can do with MyChart, go to [email or web address].    Your next appointment:   6 month(s)  The format for your next appointment:   In Person  Provider:   Eleonore Chiquito, MD

## 2022-04-21 LAB — TSH: TSH: 1.97 u[IU]/mL (ref 0.450–4.500)

## 2022-07-01 ENCOUNTER — Ambulatory Visit
Admission: RE | Admit: 2022-07-01 | Discharge: 2022-07-01 | Disposition: A | Discharge status: Home / Self Care | Payer: 59 | Source: Ambulatory Visit | Attending: Family | Admitting: Family

## 2022-07-01 DIAGNOSIS — R921 Mammographic calcification found on diagnostic imaging of breast: Secondary | ICD-10-CM

## 2022-07-01 DIAGNOSIS — N6310 Unspecified lump in the right breast, unspecified quadrant: Secondary | ICD-10-CM

## 2022-07-14 ENCOUNTER — Other Ambulatory Visit: Payer: Self-pay | Admitting: Family

## 2022-07-14 DIAGNOSIS — R921 Mammographic calcification found on diagnostic imaging of breast: Secondary | ICD-10-CM

## 2023-01-28 ENCOUNTER — Telehealth: Payer: Self-pay | Admitting: Family

## 2023-01-28 NOTE — Telephone Encounter (Signed)
Patient was called to clarify that she does have new PCP; patient confirmed and mammogram order will be sent back to [facility] with this information documented.

## 2023-01-28 NOTE — Progress Notes (Signed)
Cardiology Office Note:   Date:  01/29/2023  NAME:  Bethany Travis    MRN: [id number] DOB:  10/15/1945   PCP:  Worthy Rancher, MD  Cardiologist:  None  Electrophysiologist:  None   Referring MD: Worthy Rancher, MD   Chief Complaint  Patient presents with   Follow-up         History of Present Illness:   Bethany Travis is a 77 y.o. female with a hx of a tach, CLL, HTN who presents for follow-up.  She reports no further palpitations.  Metoprolol seems to be doing the trick.  She does report dizziness in the morning.  She reports when she first gets up she gets up from the bed to go to the kitchen.  She reports while sitting at the stove or standing at the stove she can get dizzy and lightheaded.  She has not passed out but can also get short of breath.  Symptoms can last up to 30 minutes.  She also reports decreased energy with activity.  She reports when she walks a certain distance she will get very short of breath.  She feels good when resting but activity does get her quite winded.  Her most recent echo from 7 years ago was unremarkable.  I would like to repeat this.  She reports no chest pain.  Just simply shortness of breath.  CV exam normal today.  EKG shows a right bundle branch block which is chronic.  Her CLL is stable.  She is not diabetic.  All of her lab work is recently within limits.  Problem List Atrial tachycardia -10 beat maximum  HTN CLL Non-hodgkins lymphoma (in remission 2017) RBBB  Past Medical History: Past Medical History:  Diagnosis Date   Chronic neck and back pain    Hypertension    Leukemia ([facility])    No chemo    Past Surgical History: Past Surgical History:  Procedure Laterality Date   CERVICAL SPINE SURGERY      Current Medications: Current Meds  Medication Sig   Clobetasol Prop Emollient Base (CLOBETASOL PROPIONATE E) 0.05 % emollient cream APPLY TO AFFECTED AREA TWICE A DAY   escitalopram (LEXAPRO) 10 MG  tablet Take 1 tablet (10 mg total) by mouth daily.   gabapentin (NEURONTIN) 300 MG capsule    hydrOXYzine (ATARAX) 10 MG tablet Take 1 tablet (10 mg total) by mouth 3 (three) times daily as needed for itching.   methocarbamol (ROBAXIN) 750 MG tablet Take by mouth.   metoprolol succinate (TOPROL XL) 25 MG 24 hr tablet Take 1 tablet (25 mg total) by mouth daily.   pantoprazole (PROTONIX) 40 MG tablet Take by mouth.   rosuvastatin (CRESTOR) 10 MG tablet Take 1 tablet (10 mg total) by mouth daily.   valACYclovir (VALTREX) 500 MG tablet Take 1 tablet po bid x 5 days prn with outbreak   [DISCONTINUED] NIFEdipine (ADALAT CC) 30 MG 24 hr tablet      Allergies:    Cefepime and Hydrochlorothiazide   Social History: Social History   Socioeconomic History   Marital status: Divorced    Spouse name: Not on file   Number of children: 3   Years of education: Not on file   Highest education level: Not on file  Occupational History   Occupation: retired  Tobacco Use   Smoking status: Never   Smokeless tobacco: Never  Vaping Use   Vaping status: Never Used  Substance and Sexual Activity  Alcohol use: Never   Drug use: Never   Sexual activity: Not on file  Other Topics Concern   Not on file  Social History Narrative   Not on file   Social Determinants of Health   Financial Resource Strain: Not on file  Food Insecurity: Not on file  Transportation Needs: Not on file  Physical Activity: Not on file  Stress: Not on file  Social Connections: Not on file     Family History: The patient's family history includes Heart attack in her father.  ROS:   All other ROS reviewed and negative. Pertinent positives noted in the HPI.     EKGs/Labs/Other Studies Reviewed:   The following studies were personally reviewed by me today:  EKG:  EKG is ordered today.    EKG Interpretation Date/Time:  Friday January 29 2023 13:10:17 EDT Ventricular Rate:  66 PR Interval:  196 QRS Duration:  124 QT  Interval:  418 QTC Calculation: 438 R Axis:   7  Text Interpretation: Normal sinus rhythm with sinus arrhythmia Right bundle branch block Confirmed by Lennie Odor [phone]) on 01/29/2023 1:14:14 PM   [place] 12/15/2021   Sinus rhythm with average heart rate of 64 bpm.   First-degree AV block-benign.   Brief episodes of atrial tachycardia noted.   Rare PVCs, rare PACs.  Benign.   No atrial fibrillation detected.  Recent Labs: 04/20/2022: TSH 1.970   Recent Lipid Panel    Component Value Date/Time   CHOL 234 (H) 07/26/2020 0951   TRIG 254.0 (H) 07/26/2020 0951   HDL 46.50 07/26/2020 0951   CHOLHDL 5 07/26/2020 0951   VLDL 50.8 (H) 07/26/2020 0951   LDLCALC 91 01/02/2016 0400   LDLDIRECT 142.0 07/26/2020 0951    Physical Exam:   VS:  BP 135/85 (BP Location: Right Arm, Patient Position: Sitting, Cuff Size: Normal)   Pulse 66   Ht 5\' 1"  (1.549 m)   Wt 175 lb (79.4 kg)   SpO2 96%   BMI 33.07 kg/m    Wt Readings from Last 3 Encounters:  01/29/23 175 lb (79.4 kg)  04/20/22 173 lb 6.4 oz (78.7 kg)  10/02/21 172 lb 6 oz (78.2 kg)    General: Well nourished, well developed, in no acute distress Head: Atraumatic, normal size  Eyes: PEERLA, EOMI  Neck: Supple, no JVD Endocrine: No thryomegaly Cardiac: Normal S1, S2; RRR; no murmurs, rubs, or gallops Lungs: Clear to auscultation bilaterally, no wheezing, rhonchi or rales  Abd: Soft, nontender, no hepatomegaly  Ext: No edema, pulses 2+ Musculoskeletal: No deformities, BUE and BLE strength normal and equal Skin: Warm and dry, no rashes   Neuro: Alert and oriented to person, place, time, and situation, CNII-XII grossly intact, no focal deficits  Psych: Normal mood and affect   ASSESSMENT:   Bethany Travis is a 77 y.o. female who presents for the following: 1. Palpitations   2. Atrial tachycardia   3. RBBB   4. Dizziness   5. Mixed hyperlipidemia   6. Other fatigue     PLAN:   1. Palpitations 2. Atrial  tachycardia 3. RBBB 4. Dizziness 5. Mixed hyperlipidemia 6. Other fatigue -Atrial tachycardia symptoms have resolved with metoprolol succinate 25 mg daily.  She does describe dizziness in the morning.  Suspect her blood pressure could be too low.  Would recommend she stop nifedipine and keep a close eye on her blood pressure.  This may actually resolve her dizziness in the mornings.  She does  have a right bundle branch block but no evidence of conduction disease that is significant on her monitor.  We may want to pursue this if symptoms persist.  I would like also to obtain a repeat echocardiogram given her symptoms of dizziness and shortness of breath.  She reports no chest discomfort and EKG is unchanged.  Really low suspicion for angina.  Could consider a stress test if the above workup is negative.  We will have her come back in 4 to 6 weeks for blood pressure check with an APP.  She will see me in 6 months.      Disposition: Return in about 6 weeks (around 03/12/2023).  Medication Adjustments/Labs and Tests Ordered: Current medicines are reviewed at length with the patient today.  Concerns regarding medicines are outlined above.  Orders Placed This Encounter  Procedures   EKG 12-Lead   ECHOCARDIOGRAM COMPLETE   No orders of the defined types were placed in this encounter.  Patient Instructions  Medication Instructions:  STOP NIFEDIPINE *If you need a refill on your cardiac medications before your next appointment, please call your pharmacy*   Lab Work: NONE If you have labs (blood work) drawn today and your tests are completely normal, you will receive your results only by: MyChart Message (if you have MyChart) OR A paper copy in the mail If you have any lab test that is abnormal or we need to change your treatment, we will call you to review the results.   Testing/Procedures: Your physician has requested that you have an echocardiogram. Echocardiography is a painless test  that uses sound waves to create images of your heart. It provides your doctor with information about the size and shape of your heart and how well your heart's chambers and valves are working. This procedure takes approximately one hour. There are no restrictions for this procedure. Please do NOT wear cologne, perfume, aftershave, or lotions (deodorant is allowed). Please arrive 15 minutes prior to your appointment time.    Follow-Up: At [facility], you and your health needs are our priority.  As part of our continuing mission to provide you with exceptional heart care, we have created designated Provider Care Teams.  These Care Teams include your primary Cardiologist (physician) and Advanced Practice Providers (APPs -  Physician Assistants and Nurse Practitioners) who all work together to provide you with the care you need, when you need it.  We recommend signing up for the patient portal called "MyChart".  Sign up information is provided on this After Visit Summary.  MyChart is used to connect with patients for Virtual Visits (Telemedicine).  Patients are able to view lab/test results, encounter notes, upcoming appointments, etc.  Non-urgent messages can be sent to your provider as well.   To learn more about what you can do with MyChart, go to [email or web address].    Your next appointment:   4 week(s)  Provider:   Edd Fabian, FNP, Micah Flesher, PA-C, Marjie Skiff, PA-C, Joni Reining, DNP, ANP, Azalee Course, PA-C, or Carlos Levering, NP    AND  DR Scharlene Gloss  will plan to see you again in 6 month(s).  Other Instructions CHECK B/P TWICE DAILY     Time Spent with Patient: I have spent a total of 35 minutes with patient reviewing hospital notes, telemetry, EKGs, labs and examining the patient as well as establishing an assessment and plan that was discussed with the patient.  > 50% of time was spent in direct patient care.  Signed, Lenna Gilford. Flora Lipps, MD, [facility]   [facility]  [address] [place], [place] [address] [phone]  01/29/2023 2:24 PM

## 2023-01-29 ENCOUNTER — Ambulatory Visit: Payer: 59 | Admitting: Cardiovascular Disease

## 2023-01-29 ENCOUNTER — Encounter: Payer: Self-pay | Admitting: Cardiovascular Disease

## 2023-01-29 ENCOUNTER — Telehealth: Payer: Self-pay | Admitting: Internal Medicine

## 2023-01-29 VITALS — BP 135/85 | HR 66 | Ht 61.0 in | Wt 175.0 lb

## 2023-01-29 DIAGNOSIS — R5383 Other fatigue: Secondary | ICD-10-CM

## 2023-01-29 DIAGNOSIS — E782 Mixed hyperlipidemia: Secondary | ICD-10-CM

## 2023-01-29 DIAGNOSIS — I4719 Other supraventricular tachycardia: Secondary | ICD-10-CM

## 2023-01-29 DIAGNOSIS — R42 Dizziness and giddiness: Secondary | ICD-10-CM

## 2023-01-29 DIAGNOSIS — I4510 Unspecified right bundle-branch block: Secondary | ICD-10-CM

## 2023-01-29 DIAGNOSIS — R002 Palpitations: Secondary | ICD-10-CM

## 2023-01-29 NOTE — Telephone Encounter (Signed)
Spoke with Bronson Ing to inform her, pt is no longer a pt with [facility], and form has been faxed over to pts PCP.

## 2023-01-29 NOTE — Telephone Encounter (Signed)
Yvette from [facility] called & wanted to follow up on a document she faxed earlier this week but has not heard back so she re faxed it again this morning and Attn Ria Clock. Please f/u.

## 2023-01-29 NOTE — Patient Instructions (Addendum)
Medication Instructions:  STOP NIFEDIPINE *If you need a refill on your cardiac medications before your next appointment, please call your pharmacy*   Lab Work: NONE If you have labs (blood work) drawn today and your tests are completely normal, you will receive your results only by: MyChart Message (if you have MyChart) OR A paper copy in the mail If you have any lab test that is abnormal or we need to change your treatment, we will call you to review the results.   Testing/Procedures: Your physician has requested that you have an echocardiogram. Echocardiography is a painless test that uses sound waves to create images of your heart. It provides your doctor with information about the size and shape of your heart and how well your heart's chambers and valves are working. This procedure takes approximately one hour. There are no restrictions for this procedure. Please do NOT wear cologne, perfume, aftershave, or lotions (deodorant is allowed). Please arrive 15 minutes prior to your appointment time.    Follow-Up: At [facility], you and your health needs are our priority.  As part of our continuing mission to provide you with exceptional heart care, we have created designated Provider Care Teams.  These Care Teams include your primary Cardiologist (physician) and Advanced Practice Providers (APPs -  Physician Assistants and Nurse Practitioners) who all work together to provide you with the care you need, when you need it.  We recommend signing up for the patient portal called "MyChart".  Sign up information is provided on this After Visit Summary.  MyChart is used to connect with patients for Virtual Visits (Telemedicine).  Patients are able to view lab/test results, encounter notes, upcoming appointments, etc.  Non-urgent messages can be sent to your provider as well.   To learn more about what you can do with MyChart, go to [email or web address].    Your next appointment:   4  week(s)  Provider:   Edd Fabian, FNP, Micah Flesher, PA-C, Marjie Skiff, PA-C, Joni Reining, DNP, ANP, Azalee Course, PA-C, or Carlos Levering, NP    AND  DR Scharlene Gloss  will plan to see you again in 6 month(s).  Other Instructions CHECK B/P TWICE DAILY

## 2023-02-01 ENCOUNTER — Other Ambulatory Visit: Payer: Self-pay | Admitting: Family

## 2023-02-01 ENCOUNTER — Ambulatory Visit: Admission: RE | Admit: 2023-02-01 | Payer: 59 | Source: Ambulatory Visit

## 2023-02-01 ENCOUNTER — Ambulatory Visit
Admission: RE | Admit: 2023-02-01 | Discharge: 2023-02-01 | Disposition: A | Discharge status: Home / Self Care | Payer: 59 | Source: Ambulatory Visit | Attending: Family | Admitting: Family

## 2023-02-01 DIAGNOSIS — R599 Enlarged lymph nodes, unspecified: Secondary | ICD-10-CM

## 2023-02-01 DIAGNOSIS — Z856 Personal history of leukemia: Secondary | ICD-10-CM

## 2023-02-01 DIAGNOSIS — R921 Mammographic calcification found on diagnostic imaging of breast: Secondary | ICD-10-CM

## 2023-02-04 ENCOUNTER — Ambulatory Visit
Admission: RE | Admit: 2023-02-04 | Discharge: 2023-02-04 | Disposition: A | Discharge status: Home / Self Care | Payer: 59 | Source: Ambulatory Visit | Attending: Family | Admitting: Family

## 2023-02-04 ENCOUNTER — Other Ambulatory Visit (HOSPITAL_COMMUNITY)
Admission: RE | Admit: 2023-02-04 | Discharge: 2023-02-04 | Disposition: A | Discharge status: Home / Self Care | Payer: 59 | Source: Ambulatory Visit | Attending: Family | Admitting: Family

## 2023-02-04 DIAGNOSIS — Z856 Personal history of leukemia: Secondary | ICD-10-CM

## 2023-02-04 DIAGNOSIS — R599 Enlarged lymph nodes, unspecified: Secondary | ICD-10-CM

## 2023-02-08 LAB — SURGICAL PATHOLOGY

## 2023-02-11 ENCOUNTER — Other Ambulatory Visit: Payer: Self-pay

## 2023-02-18 ENCOUNTER — Ambulatory Visit (HOSPITAL_COMMUNITY): Payer: 59 | Attending: Cardiovascular Disease

## 2023-02-18 DIAGNOSIS — R002 Palpitations: Secondary | ICD-10-CM

## 2023-02-18 DIAGNOSIS — I361 Nonrheumatic tricuspid (valve) insufficiency: Secondary | ICD-10-CM | POA: Diagnosis not present

## 2023-02-18 DIAGNOSIS — E785 Hyperlipidemia, unspecified: Secondary | ICD-10-CM | POA: Insufficient documentation

## 2023-02-18 DIAGNOSIS — I4719 Other supraventricular tachycardia: Secondary | ICD-10-CM

## 2023-02-18 DIAGNOSIS — I071 Rheumatic tricuspid insufficiency: Secondary | ICD-10-CM | POA: Diagnosis not present

## 2023-02-18 DIAGNOSIS — I4892 Unspecified atrial flutter: Secondary | ICD-10-CM | POA: Diagnosis not present

## 2023-02-18 DIAGNOSIS — I10 Essential (primary) hypertension: Secondary | ICD-10-CM | POA: Diagnosis not present

## 2023-02-18 LAB — ECHOCARDIOGRAM COMPLETE
Area-P 1/2: 2.45 cm2
S' Lateral: 2.4 cm

## 2023-02-24 NOTE — Progress Notes (Signed)
Cardiology Office Note:  .   Date:  02/26/2023  ID:  Bethany Travis, DOB [birth year], MRN [id number] PCP: Corliss Blacker, MD  [facility] Providers Cardiologist:  Reatha Harps, MD    History of Present Illness: [name]   Bethany Travis Bethany Travis is a 77 y.o. female with past medical history of atrial tachycardia, CLL, HTN, RBBB and palpitations. She is followed by Dr. Flora Lipps. She presents today for follow up regarding HTN.   She was first seen by Dr. Flora Lipps in 03/2022 for palpitations.  She wore a cardiac monitor for 10 days that showed an average heart rate of 64 bpm, ranging from 44 to 143 bpm.  Underlying rhythm was sinus, first-degree AV block was present.  There were 18 runs of SVT.  She was started on metoprolol.  She was last seen on 01/29/2023 by Dr. Flora Lipps for follow up.  She denied further palpitations.  Although she did report dizziness in the morning as well as shortness of breath with symptoms lasting up to 30 minutes.  He denies chest pain.  Her EKG showed right bundle branch block that is chronic.  It was recommended that she stop her nifedipine given dizziness.  She underwent echocardiogram on 02/18/2023 that showed LVEF of 65 to 70%, LV with normal function, no RWMA.  No significant valve issues.  Today she presents for follow-up.  She reports that she is doing very well overall.  She has stopped her nifedipine, since stopping she has had complete resolution of her symptoms.  She denies any further dizziness, lightheadedness or shortness of breath.  On review she reports that her shortness of breath only occurred when she was having her dizzy spells.  She notes that she is having to follow-up with her oncologist next week to review recent biopsy results.  Notes that lately she has not been very active due to the heat.  ROS: Today she denies chest pain, shortness of breath, lower extremity edema, fatigue, palpitations, melena, hematuria, hemoptysis,  diaphoresis, weakness, presyncope, syncope, orthopnea, and PND.   Studies Reviewed: .       Cardiac Studies & Procedures     STRESS TESTS  NM MYOCAR MULTI W/SPECT W 01/03/2016  Narrative CLINICAL DATA:  Chest pain  EXAM: MYOCARDIAL IMAGING WITH SPECT (REST AND PHARMACOLOGIC-STRESS)  GATED LEFT VENTRICULAR WALL MOTION STUDY  LEFT VENTRICULAR EJECTION FRACTION  TECHNIQUE: Standard myocardial SPECT imaging was performed after resting intravenous injection of 10 mCi Tc-53m tetrofosmin. Subsequently, intravenous infusion of Lexiscan was performed under the supervision of the Cardiology staff. At peak effect of the drug, 30 mCi Tc-21m tetrofosmin was injected intravenously and standard myocardial SPECT imaging was performed. Quantitative gated imaging was also performed to evaluate left ventricular wall motion, and estimate left ventricular ejection fraction.  COMPARISON:  None.  FINDINGS: Perfusion: There is a moderate-sized fixed perfusion defect at the apex which extends into the lateral wall. No definite stress-induced ischemia.  Wall Motion: Normal left ventricular wall motion. No left ventricular dilation.  Left Ventricular Ejection Fraction: 89 %  End diastolic volume 60 ml  End systolic volume 7 ml  IMPRESSION: 1. There is a fixed perfusion defect at the apex but no definite stress-induced ischemia.  2. Normal left ventricular wall motion.  3. Left ventricular ejection fraction 89%  4. Non invasive risk stratification*: Low risk  *2012 Appropriate Use Criteria for Coronary Revascularization Focused Update: J Am Coll Cardiol. 2012;59(9):857-881. http://content.dementiazones.com.aspx?articleid=1201161   Electronically Signed By: Merton Border  Hoss M.D. On: 01/03/2016 13:17   ECHOCARDIOGRAM  ECHOCARDIOGRAM COMPLETE 02/18/2023  Narrative ECHOCARDIOGRAM REPORT    Patient Name:   Bethany Travis Date of Exam: 02/18/2023 Medical Rec #:   [id number]                     Height:       61.0 in Accession #:    [id number]                    Weight:       175.0 lb Date of Birth:  Jan 31, 1946                    BSA:          1.785 m Patient Age:    34 years                      BP:           143/84 mmHg Patient Gender: F                             HR:           59 bpm. Exam Location:  [address]  Procedure: 2D Echo, 3D Echo, Cardiac Doppler and Color Doppler  Indications:    I48.92 Atrial Flutter  History:        Patient has prior history of Echocardiogram examinations, most recent 01/02/2016. Arrythmias:RBBB and Atrial Tachycardia, Signs/Symptoms:Dizziness/Lightheadedness; Risk Factors:Hypertension and HLD.  Sonographer:    Clearence Ped RCS Referring Phys: [id number] Ronnald Ramp O'NEAL  IMPRESSIONS   1. Left ventricular ejection fraction, by estimation, is 65 to 70%. Left ventricular ejection fraction by 3D volume is 71 %. The left ventricle has normal function. The left ventricle has no regional wall motion abnormalities. Left ventricular diastolic parameters are indeterminate. The average left ventricular global longitudinal strain is -19.7 %. The global longitudinal strain is normal. 2. Right ventricular systolic function is normal. The right ventricular size is normal. There is normal pulmonary artery systolic pressure. 3. The mitral valve is normal in structure. No evidence of mitral valve regurgitation. No evidence of mitral stenosis. 4. The aortic valve is normal in structure. Aortic valve regurgitation is not visualized. Aortic valve sclerosis is present, with no evidence of aortic valve stenosis. 5. The inferior vena cava is normal in size with greater than 50% respiratory variability, suggesting right atrial pressure of 3 mmHg.  FINDINGS Left Ventricle: Left ventricular ejection fraction, by estimation, is 65 to 70%. Left ventricular ejection fraction by 3D volume is 71 %. The left ventricle has normal function. The  left ventricle has no regional wall motion abnormalities. The average left ventricular global longitudinal strain is -19.7 %. The global longitudinal strain is normal. The left ventricular internal cavity size was normal in size. There is no left ventricular hypertrophy. Left ventricular diastolic parameters are indeterminate.  Right Ventricle: The right ventricular size is normal. No increase in right ventricular wall thickness. Right ventricular systolic function is normal. There is normal pulmonary artery systolic pressure. The tricuspid regurgitant velocity is 1.80 m/s, and with an assumed right atrial pressure of 3 mmHg, the estimated right ventricular systolic pressure is 16.0 mmHg.  Left Atrium: Left atrial size was normal in size.  Right Atrium: Right atrial size was normal in size.  Pericardium: There is no evidence of pericardial effusion. Presence  of epicardial fat layer.  Mitral Valve: The mitral valve is normal in structure. No evidence of mitral valve regurgitation. No evidence of mitral valve stenosis.  Tricuspid Valve: The tricuspid valve is normal in structure. Tricuspid valve regurgitation is mild . No evidence of tricuspid stenosis.  Aortic Valve: The aortic valve is normal in structure. Aortic valve regurgitation is not visualized. Aortic valve sclerosis is present, with no evidence of aortic valve stenosis.  Pulmonic Valve: The pulmonic valve was not well visualized. Pulmonic valve regurgitation is trivial. No evidence of pulmonic stenosis.  Aorta: The aortic root is normal in size and structure.  Venous: The inferior vena cava is normal in size with greater than 50% respiratory variability, suggesting right atrial pressure of 3 mmHg.  IAS/Shunts: No atrial level shunt detected by color flow Doppler.   LEFT VENTRICLE PLAX 2D LVIDd:         3.80 cm         Diastology LVIDs:         2.40 cm         LV e' medial:    3.48 cm/s LV PW:         0.90 cm         LV E/e'  medial:  13.4 LV IVS:        0.90 cm         LV e' lateral:   8.16 cm/s LVOT diam:     1.70 cm         LV E/e' lateral: 5.7 LV SV:         52 LV SV Index:   29              2D LVOT Area:     2.27 cm        Longitudinal Strain 2D Strain GLS  -18.4 % (A2C): 2D Strain GLS  -19.0 % (A3C): 2D Strain GLS  -21.6 % (A4C): 2D Strain GLS  -19.7 % Avg:  3D Volume EF LV 3D EF:    Left ventricul ar ejection fraction by 3D volume is 71 %.  3D Volume EF: 3D EF:        71 % LV EDV:       52 ml LV ESV:       15 ml LV SV:        37 ml  RIGHT VENTRICLE RV Basal diam:  2.20 cm RV S prime:     7.94 cm/s TAPSE (M-mode): 2.0 cm RVSP:           16.0 mmHg  LEFT ATRIUM             Index        RIGHT ATRIUM           Index LA diam:        3.70 cm 2.07 cm/m   RA Pressure: 3.00 mmHg LA Vol (A2C):   29.9 ml 16.75 ml/m  RA Area:     7.14 cm LA Vol (A4C):   25.5 ml 14.29 ml/m  RA Volume:   8.96 ml   5.02 ml/m LA Biplane Vol: 27.7 ml 15.52 ml/m AORTIC VALVE LVOT Vmax:   90.20 cm/s LVOT Vmean:  64.400 cm/s LVOT VTI:    0.228 m  AORTA Ao Root diam: 3.40 cm Ao Asc diam:  3.20 cm  MITRAL VALVE                TRICUSPID VALVE MV Area (PHT):  TR Peak grad:   13.0 mmHg MV Decel Time               TR Vmax:        180.00 cm/s MV E velocity: 46.60 cm/s   Estimated RAP:  3.00 mmHg MV A velocity: 110.00 cm/s  RVSP:           16.0 mmHg MV E/A ratio:  0.42 SHUNTS Systemic VTI:  0.23 m Systemic Diam: 1.70 cm  Kardie Tobb DO Electronically signed by Thomasene Ripple DO Signature Date/Time: 02/18/2023/2:03:21 PM    Final    MONITORS  LONG TERM MONITOR (3-14 DAYS) 02/27/2022  Narrative   Sinus rhythm with average heart rate of 64 bpm.   First-degree AV block-benign.   Brief episodes of atrial tachycardia noted.   Rare PVCs, rare PACs.  Benign.   No atrial fibrillation detected.   Patch Wear Time:  9 days and 23 hours (2023-08-18T10:05:32-0400 to  2023-08-28T09:32:57-0400)  Patient had a min HR of 44 bpm, max HR of 143 bpm, and avg HR of 64 bpm. Predominant underlying rhythm was Sinus Rhythm. First Degree AV Block was present. Bundle Branch Block was present. 18 Supraventricular Tachycardia runs occurred, the run with the fastest interval lasting 6 beats with a max rate of 143 bpm, the longest lasting 10 beats with an avg rate of 108 bpm. Supraventricular Tachycardia was detected within +/- 45 seconds of symptomatic patient event(s). Isolated SVEs were rare (<1.0%), SVE Couplets were rare (<1.0%), and SVE Triplets were rare (<1.0%). Isolated VEs were rare (<1.0%), and no VE Couplets or VE Triplets were present.                    Physical Exam:   VS:  BP 124/84   Pulse 65   Ht 5\' 3"  (1.6 m)   Wt 175 lb 9.6 oz (79.7 kg)   SpO2 96%   BMI 31.11 kg/m    Wt Readings from Last 3 Encounters:  02/26/23 175 lb 9.6 oz (79.7 kg)  01/29/23 175 lb (79.4 kg)  04/20/22 173 lb 6.4 oz (78.7 kg)    GEN: Well nourished, well developed in no acute distress NECK: No JVD; No carotid bruits CARDIAC: RRR, no murmurs, rubs, gallops RESPIRATORY:  Clear to auscultation without rales, wheezing or rhonchi  ABDOMEN: Soft, non-tender, non-distended EXTREMITIES:  No edema; No deformity      ASSESSMENT AND PLAN: .    Shortness of breath: Noted increased shortness of breath with exertion on visit with Dr. Val EagleJennette Kettle on 02/18/23. She underwent echocardiogram on 02/18/2023 that showed LVEF of 65 to 70%, LV with normal function, no RWMA.  No significant valve issues.  Today she reports that her shortness of breath has resolved following discontinuation of nifedipine.  Notes that she was only short of breath when having dizzy spells.  No further workup needed at this time.  Palpitations: Previously noted palpitations at OV in 03/2022, cardiac monitor indicated episodes of atrial tachycardia.  She was started on metoprolol.  She denies any further palpitations.   Continue Toprol-XL.  Dizziness/HTN: At OV on 01/29/2023 she noted increased dizziness.  Dr. Bufford Buttner stopped her nifedipine.  Blood pressure today 124/84.  Reviewed her home blood pressure logs overall appears that she is averaging 130/80, she reports that her dizziness has completely resolved following discontinuation of nifedipine.  Continue Toprol-XL.  Language barrier: Interpreter was present for entire duration of patient's visit. All questions were answered.  Dispo: Follow up with Dr. Flora Lipps or Reather Littler, NP in 6 months.   Signed, Rip Harbour, NP

## 2023-02-26 ENCOUNTER — Encounter: Payer: Self-pay | Admitting: General Practice

## 2023-02-26 ENCOUNTER — Ambulatory Visit: Payer: 59 | Attending: General Practice | Admitting: Cardiology

## 2023-02-26 VITALS — BP 124/84 | HR 65 | Ht 63.0 in | Wt 175.6 lb

## 2023-02-26 DIAGNOSIS — E782 Mixed hyperlipidemia: Secondary | ICD-10-CM | POA: Diagnosis not present

## 2023-02-26 DIAGNOSIS — R002 Palpitations: Secondary | ICD-10-CM

## 2023-02-26 DIAGNOSIS — I4719 Other supraventricular tachycardia: Secondary | ICD-10-CM

## 2023-02-26 DIAGNOSIS — R42 Dizziness and giddiness: Secondary | ICD-10-CM | POA: Diagnosis not present

## 2023-02-26 DIAGNOSIS — I4510 Unspecified right bundle-branch block: Secondary | ICD-10-CM

## 2023-02-26 NOTE — Patient Instructions (Addendum)
Medication Instructions:  The current medical regimen is effective;  continue present plan and medications as directed. Please refer to the Current Medication list given to you today.  *If you need a refill on your cardiac medications before your next appointment, please call your pharmacy*  Lab Work: NONE If you have labs (blood work) drawn today and your tests are completely normal, you will receive your results only by: MyChart Message (if you have MyChart) OR A paper copy in the mail If you have any lab test that is abnormal or we need to change your treatment, we will call you to review the results.  Testing/Procedures: NONE  Follow-Up: At [facility], you and your health needs are our priority.  As part of our continuing mission to provide you with exceptional heart care, we have created designated Provider Care Teams.  These Care Teams include your primary Cardiologist (physician) and Advanced Practice Providers (APPs -  Physician Assistants and Nurse Practitioners) who all work together to provide you with the care you need, when you need it.  We recommend signing up for the patient portal called "MyChart".  Sign up information is provided on this After Visit Summary.  MyChart is used to connect with patients for Virtual Visits (Telemedicine).  Patients are able to view lab/test results, encounter notes, upcoming appointments, etc.  Non-urgent messages can be sent to your provider as well.   To learn more about what you can do with MyChart, go to [email or web address].    Your next appointment:   6 month(s)  Provider:   Rollene Rotunda, MD  or Edd Fabian, FNP or Reather Littler, NP         Other Instructions

## 2023-04-08 ENCOUNTER — Other Ambulatory Visit: Payer: Self-pay | Admitting: Cardiovascular Disease

## 2023-05-18 IMAGING — MG DIGITAL DIAGNOSTIC BILAT W/ TOMO W/ CAD
8 series · 8 of 16 positions shown · non-contrast
Comparison: Baseline screening mammogram dated 09/05/2020.

CLINICAL DATA: Patient returns today to evaluate a possible RIGHT
breast mass identified on recent baseline screening mammogram.

Patient also returns today to evaluate LEFT breast calcifications
identified on recent screening mammogram.
EXAM:
DIGITAL DIAGNOSTIC BILATERAL MAMMOGRAM WITH TOMOSYNTHESIS AND CAD;
ULTRASOUND RIGHT BREAST LIMITED
TECHNIQUE: Bilateral digital diagnostic mammography and breast tomosynthesis
was performed. The images were evaluated with computer-aided
detection.; Targeted ultrasound examination of the right breast was
performed

[L ML (1 of 2)]
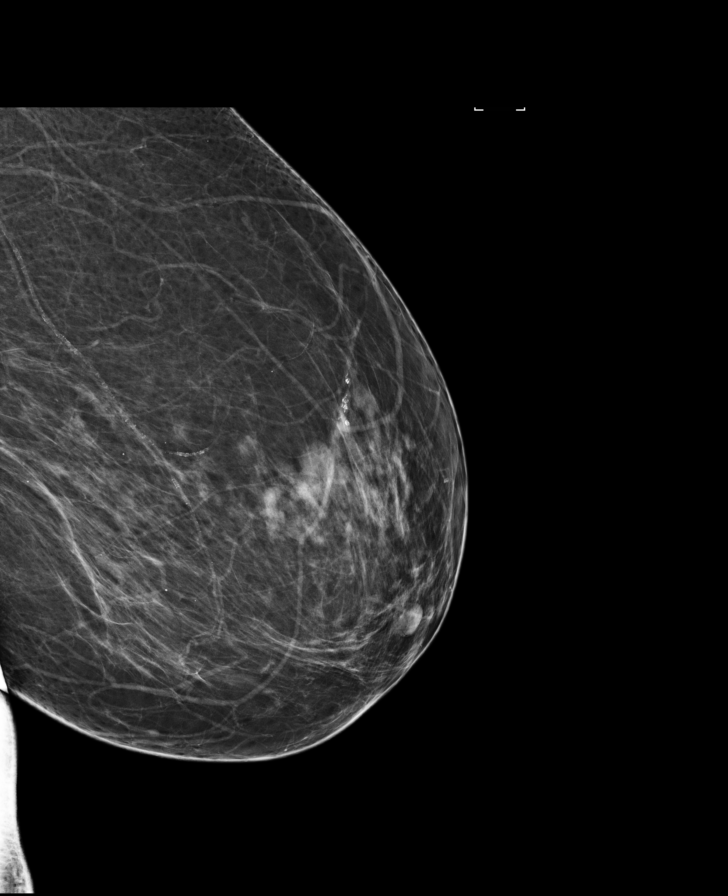

[L ML (2 of 2)]
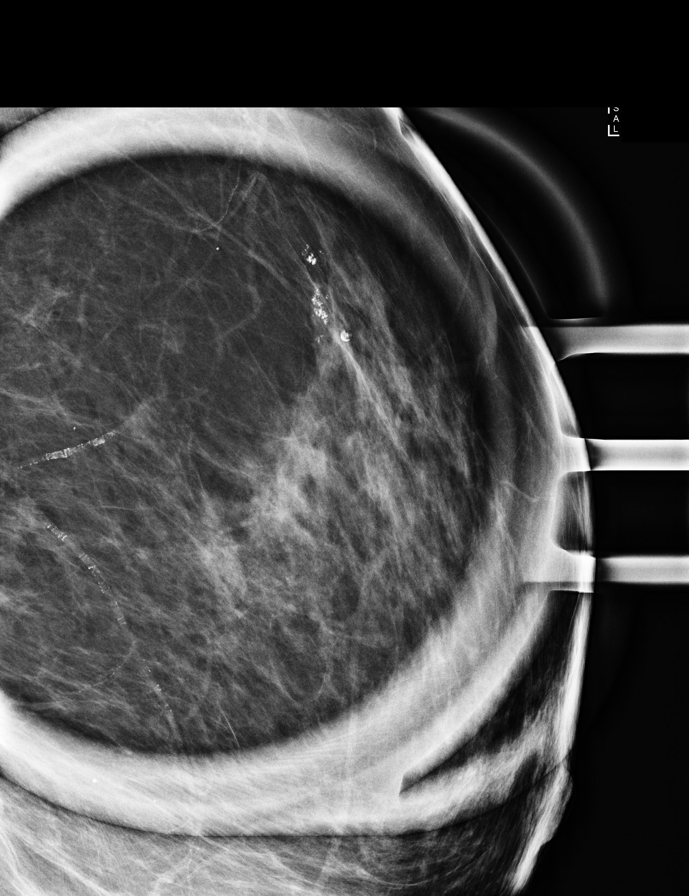

[R CC]
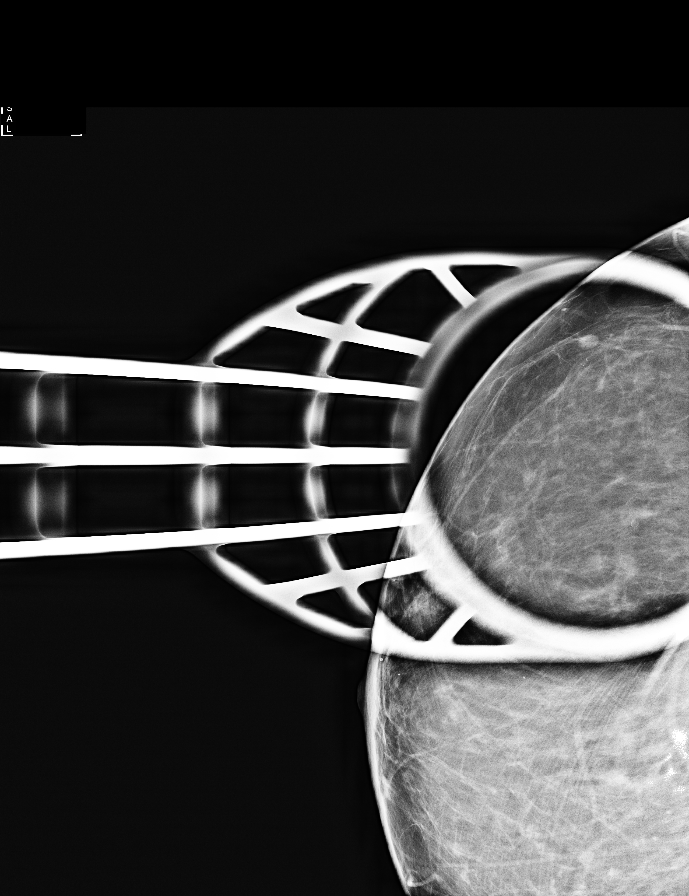

[L CC]
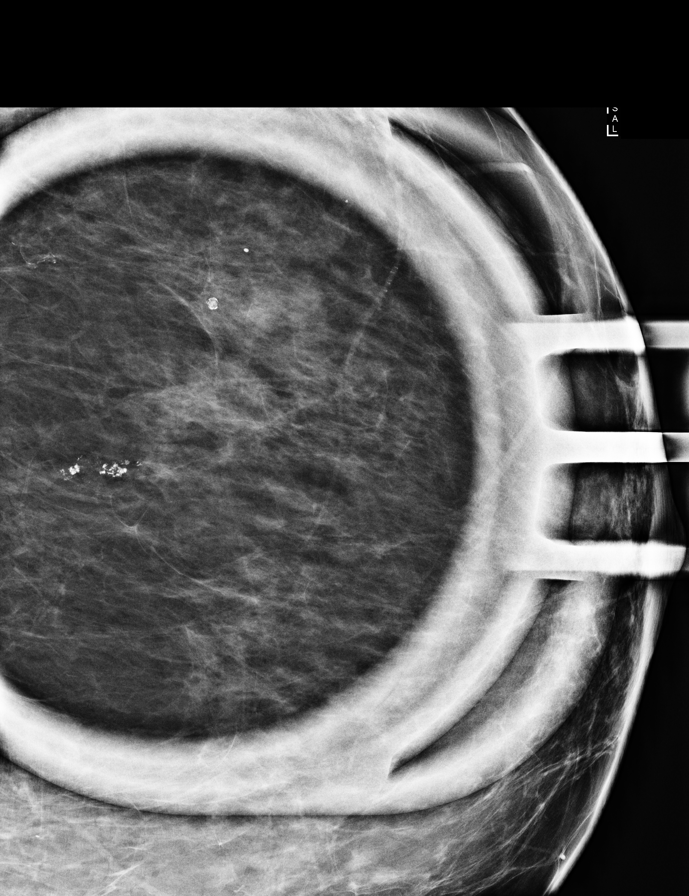

[R CC synth-2D]
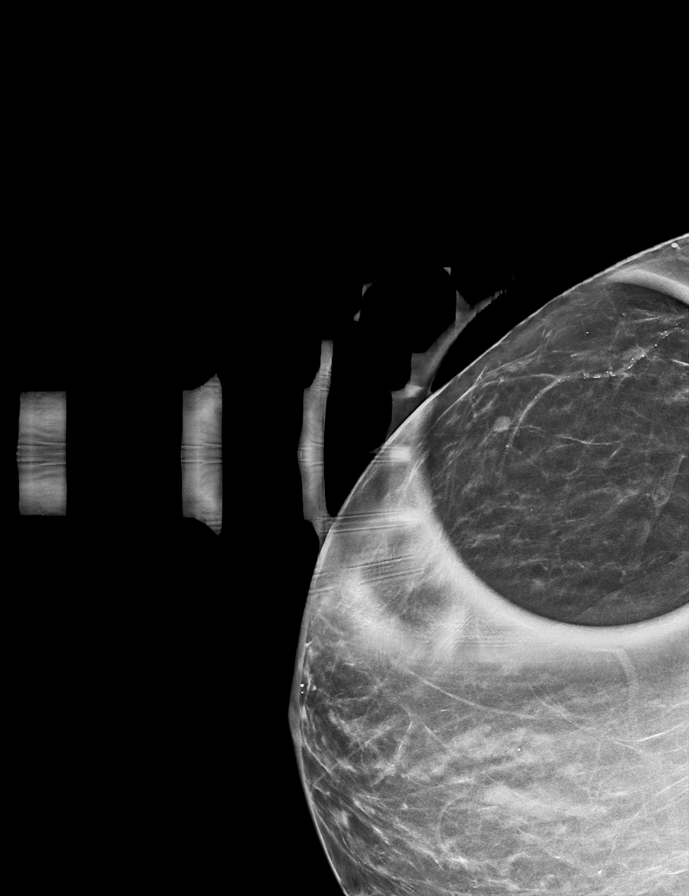

[R MLO synth-2D]
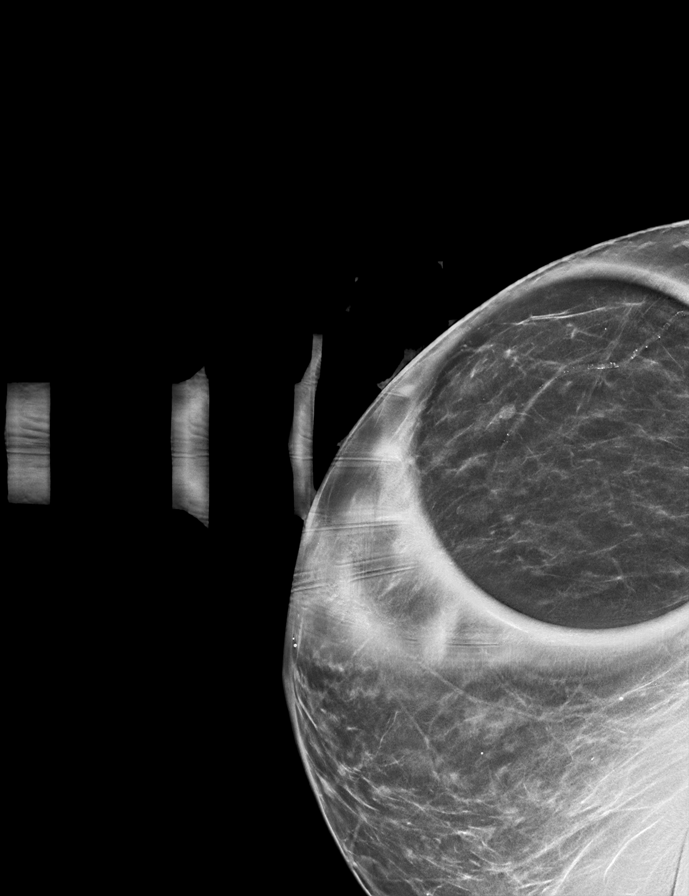

[R CC tomo · tomo slice 25/50.0]
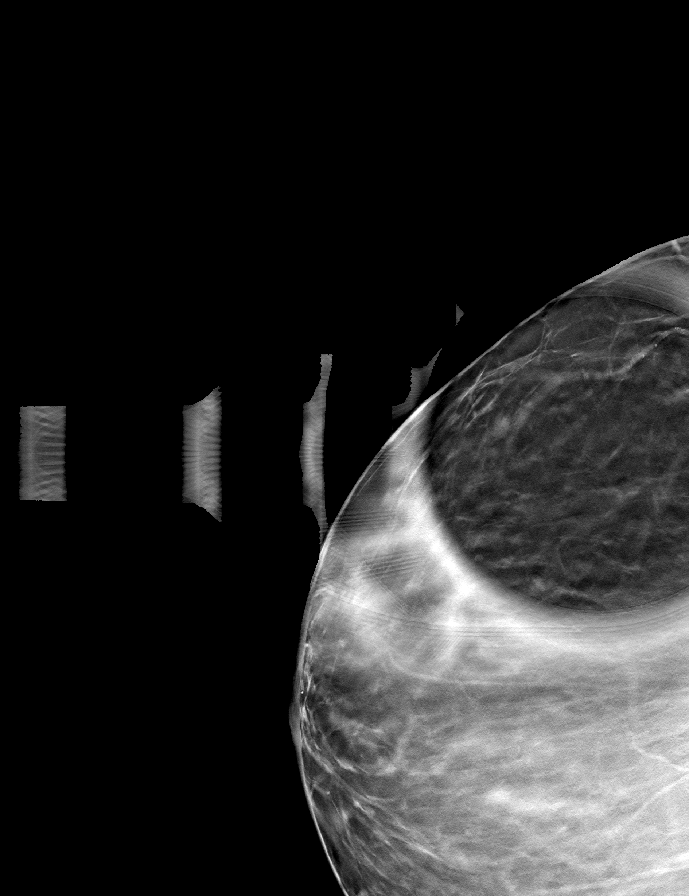

[R MLO tomo · tomo slice 30/59.0]
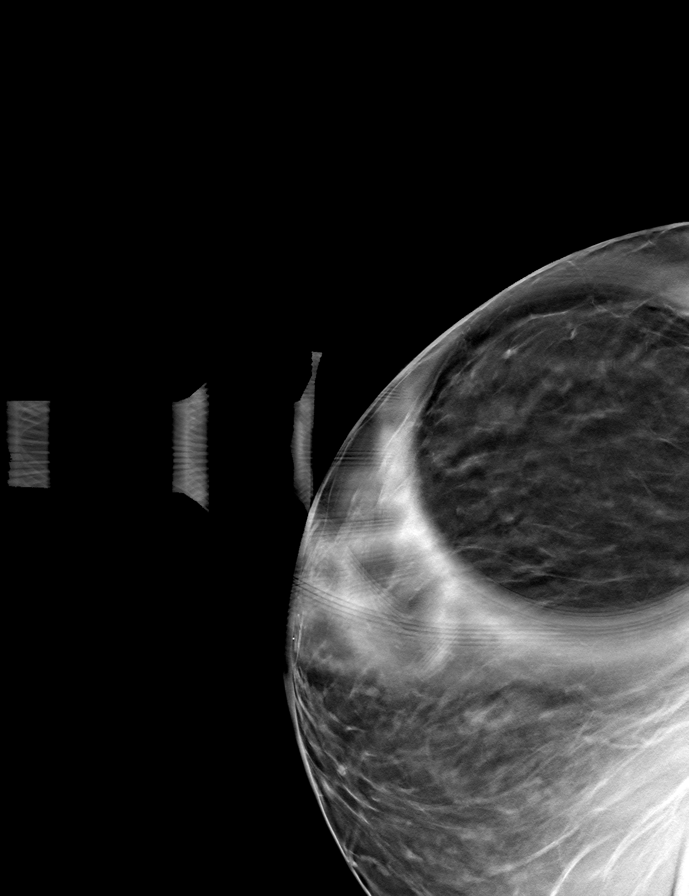

[8 of 16 positions shown; findings below may reference images not displayed]

ACR Breast Density Category c: The breast tissue is heterogeneously
dense, which may obscure small masses.
FINDINGS: RIGHT breast diagnostic mammogram: On today's additional diagnostic
views, including spot compression with 3D tomosynthesis, an oval
circumscribed mass is confirmed within the outer RIGHT breast, 8-10
o'clock axis region, measuring approximately 4 mm greatest
dimension.

LEFT breast diagnostic mammogram: On today's additional diagnostic
views, including magnification views, grouped coarse and punctate
calcifications are confirmed within the upper LEFT breast. No
suspicious pleomorphic or fine linear branching calcifications are
identified.

Targeted ultrasound is performed, showing an oval circumscribed
hypoechoic mass in the RIGHT breast at the 10 o'clock axis, 5 cm
from the nipple, measuring 4 x 3 x 3 mm, most suggestive of a benign
intramammary lymph node.
IMPRESSION: 1. Probably benign calcifications within the upper LEFT breast.
Recommend follow-up LEFT breast diagnostic mammogram in 6 months to
ensure stability.
2. Probably benign intramammary lymph node within the outer RIGHT
breast. Recommend follow-up RIGHT breast diagnostic mammogram, and
possible ultrasound, in 6 months.

RECOMMENDATION:
Bilateral diagnostic mammogram, and possible RIGHT breast
ultrasound, in 6 months.

I have discussed the findings and recommendations with the patient
with the aid of an interpreter. If applicable, a reminder letter
will be sent to the patient regarding the next appointment.

BI-RADS CATEGORY  3: Probably benign.

## 2023-10-05 ENCOUNTER — Other Ambulatory Visit: Payer: Self-pay | Admitting: Cardiovascular Disease

## 2023-11-07 ENCOUNTER — Emergency Department (HOSPITAL_COMMUNITY): Admission: EM | Admit: 2023-11-07 | Discharge: 2023-11-07 | Disposition: A | Discharge status: Home / Self Care

## 2023-11-07 ENCOUNTER — Other Ambulatory Visit: Payer: Self-pay

## 2023-11-07 ENCOUNTER — Emergency Department (HOSPITAL_COMMUNITY)

## 2023-11-07 ENCOUNTER — Encounter (HOSPITAL_COMMUNITY): Payer: Self-pay

## 2023-11-07 DIAGNOSIS — R109 Unspecified abdominal pain: Secondary | ICD-10-CM | POA: Diagnosis present

## 2023-11-07 DIAGNOSIS — I10 Essential (primary) hypertension: Secondary | ICD-10-CM | POA: Insufficient documentation

## 2023-11-07 DIAGNOSIS — D72829 Elevated white blood cell count, unspecified: Secondary | ICD-10-CM | POA: Diagnosis not present

## 2023-11-07 DIAGNOSIS — Z856 Personal history of leukemia: Secondary | ICD-10-CM | POA: Insufficient documentation

## 2023-11-07 DIAGNOSIS — N3001 Acute cystitis with hematuria: Secondary | ICD-10-CM | POA: Insufficient documentation

## 2023-11-07 DIAGNOSIS — K219 Gastro-esophageal reflux disease without esophagitis: Secondary | ICD-10-CM | POA: Insufficient documentation

## 2023-11-07 LAB — URINALYSIS, ROUTINE W REFLEX MICROSCOPIC
Bilirubin Urine: NEGATIVE
Glucose, UA: NEGATIVE mg/dL
Hgb urine dipstick: NEGATIVE
Ketones, ur: NEGATIVE mg/dL
Nitrite: NEGATIVE
Protein, ur: NEGATIVE mg/dL
Specific Gravity, Urine: 1.008 (ref 1.005–1.030)
pH: 5 (ref 5.0–8.0)

## 2023-11-07 LAB — CBC
HCT: 38.6 % (ref 36.0–46.0)
Hemoglobin: 13.5 g/dL (ref 12.0–15.0)
MCH: 29.5 pg (ref 26.0–34.0)
MCHC: 35 g/dL (ref 30.0–36.0)
MCV: 84.3 fL (ref 80.0–100.0)
Platelets: 207 10*3/uL (ref 150–400)
RBC: 4.58 MIL/uL (ref 3.87–5.11)
RDW: 14.8 % (ref 11.5–15.5)
WBC: 25 10*3/uL — ABNORMAL HIGH (ref 4.0–10.5)
nRBC: 0 % (ref 0.0–0.2)

## 2023-11-07 LAB — LIPASE, BLOOD: Lipase: 49 U/L (ref 11–51)

## 2023-11-07 LAB — CBC WITH DIFFERENTIAL/PLATELET
Abs Immature Granulocytes: 0.07 10*3/uL (ref 0.00–0.07)
Basophils Absolute: 0.1 10*3/uL (ref 0.0–0.1)
Basophils Relative: 0 %
Eosinophils Absolute: 0.1 10*3/uL (ref 0.0–0.5)
Eosinophils Relative: 0 %
HCT: 39.6 % (ref 36.0–46.0)
Hemoglobin: 13.9 g/dL (ref 12.0–15.0)
Immature Granulocytes: 0 %
Lymphocytes Relative: 72 %
Lymphs Abs: 16.8 10*3/uL — ABNORMAL HIGH (ref 0.7–4.0)
MCH: 29.4 pg (ref 26.0–34.0)
MCHC: 35.1 g/dL (ref 30.0–36.0)
MCV: 83.9 fL (ref 80.0–100.0)
Monocytes Absolute: 1.3 10*3/uL — ABNORMAL HIGH (ref 0.1–1.0)
Monocytes Relative: 5 %
Neutro Abs: 5.5 10*3/uL (ref 1.7–7.7)
Neutrophils Relative %: 23 %
Platelets: 196 10*3/uL (ref 150–400)
RBC: 4.72 MIL/uL (ref 3.87–5.11)
RDW: 14.8 % (ref 11.5–15.5)
Smear Review: NORMAL
WBC: 23.8 10*3/uL — ABNORMAL HIGH (ref 4.0–10.5)
nRBC: 0.1 % (ref 0.0–0.2)

## 2023-11-07 LAB — COMPREHENSIVE METABOLIC PANEL WITH GFR
ALT: 18 U/L (ref 0–44)
AST: 21 U/L (ref 15–41)
Albumin: 4.3 g/dL (ref 3.5–5.0)
Alkaline Phosphatase: 36 U/L — ABNORMAL LOW (ref 38–126)
Anion gap: 10 (ref 5–15)
BUN: 21 mg/dL (ref 8–23)
CO2: 20 mmol/L — ABNORMAL LOW (ref 22–32)
Calcium: 9.5 mg/dL (ref 8.9–10.3)
Chloride: 106 mmol/L (ref 98–111)
Creatinine, Ser: 1.03 mg/dL — ABNORMAL HIGH (ref 0.44–1.00)
GFR, Estimated: 56 mL/min — ABNORMAL LOW (ref >60)
Glucose, Bld: 107 mg/dL — ABNORMAL HIGH (ref 70–99)
Potassium: 4.3 mmol/L (ref 3.5–5.1)
Sodium: 136 mmol/L (ref 135–145)
Total Bilirubin: 1.9 mg/dL — ABNORMAL HIGH (ref 0.0–1.2)
Total Protein: 7 g/dL (ref 6.5–8.1)

## 2023-11-07 LAB — TYPE AND SCREEN
ABO/RH(D): A POS
Antibody Screen: NEGATIVE

## 2023-11-07 LAB — I-STAT CG4 LACTIC ACID, ED
Lactic Acid, Venous: 0.3 mmol/L — ABNORMAL LOW (ref 0.5–1.9)
Lactic Acid, Venous: 0.8 mmol/L (ref 0.5–1.9)

## 2023-11-07 LAB — POC OCCULT BLOOD, ED: Fecal Occult Bld: NEGATIVE

## 2023-11-07 MED ORDER — ONDANSETRON HCL 4 MG/2ML IJ SOLN
4.0000 mg | Freq: Once | INTRAMUSCULAR | Status: AC
  Administered 2023-11-07: 4 mg via INTRAVENOUS
  Filled 2023-11-07: qty 2

## 2023-11-07 MED ORDER — OXYCODONE HCL 5 MG PO TABS: 5.0000 mg | ORAL_TABLET | ORAL | 0 refills | Status: AC | PRN

## 2023-11-07 MED ORDER — FENTANYL CITRATE PF 50 MCG/ML IJ SOSY
50.0000 ug | PREFILLED_SYRINGE | Freq: Once | INTRAMUSCULAR | Status: AC
  Administered 2023-11-07: 50 ug via INTRAVENOUS
  Filled 2023-11-07: qty 1

## 2023-11-07 MED ORDER — PANTOPRAZOLE SODIUM 40 MG PO TBEC: 40.0000 mg | DELAYED_RELEASE_TABLET | Freq: Every day | ORAL | 1 refills | Status: AC

## 2023-11-07 MED ORDER — SODIUM CHLORIDE 0.9 % IV SOLN
1.0000 g | Freq: Once | INTRAVENOUS | Status: AC
  Administered 2023-11-07: 1 g via INTRAVENOUS
  Filled 2023-11-07: qty 10

## 2023-11-07 MED ORDER — PANTOPRAZOLE SODIUM 40 MG IV SOLR
40.0000 mg | Freq: Once | INTRAVENOUS | Status: AC
  Administered 2023-11-07: 40 mg via INTRAVENOUS
  Filled 2023-11-07: qty 10

## 2023-11-07 MED ORDER — ALUM & MAG HYDROXIDE-SIMETH 200-200-20 MG/5ML PO SUSP
30.0000 mL | Freq: Once | ORAL | Status: AC
Start: 2023-11-07 — End: 2023-11-07
  Administered 2023-11-07: 30 mL via ORAL
  Filled 2023-11-07: qty 30

## 2023-11-07 MED ORDER — CEPHALEXIN 500 MG PO CAPS: 500.0000 mg | ORAL_CAPSULE | Freq: Three times a day (TID) | ORAL | 0 refills | Status: AC

## 2023-11-07 MED ORDER — MAALOX MAX 400-400-40 MG/5ML PO SUSP: 15.0000 mL | Freq: Four times a day (QID) | ORAL | 0 refills | Status: AC | PRN

## 2023-11-07 MED ORDER — IOHEXOL 350 MG/ML SOLN
75.0000 mL | Freq: Once | INTRAVENOUS | Status: AC | PRN
  Administered 2023-11-07: 75 mL via INTRAVENOUS

## 2023-11-07 NOTE — ED Provider Notes (Signed)
 [facility] Provider Note   CSN: [id number] Arrival date & time: 11/07/23  1329     History  Chief Complaint  Patient presents with   Abdominal Pain    Bethany Travis is a 78 y.o. female.  78 year old female presents with her son for concern of abdominal pain along with melanotic stools.  She states the melanotic stools started 3 days ago.  She has been taking Pepto-Bismol over this timeframe.  Has history of gastric ulcer but no history of GI bleed.  Also complains of dysuria.  Denies fever or flank pain. Some interpreted for the patient.  I offered interpreter services but she declined.  The history is provided by the patient. No language interpreter was used.       Home Medications Prior to Admission medications   Medication Sig Start Date End Date Taking? Authorizing Provider  alum & mag hydroxide-simeth (MAALOX MAX) 400-400-40 MG/5ML suspension Take 15 mLs by mouth every 6 (six) hours as needed for indigestion. 11/07/23  Yes Brach Birdsall, PA-C  cephALEXin (KEFLEX) 500 MG capsule Take 1 capsule (500 mg total) by mouth 3 (three) times daily for 7 days. 11/07/23 11/14/23 Yes Azyria Osmon, PA-C  oxyCODONE  (ROXICODONE ) 5 MG immediate release tablet Take 1 tablet (5 mg total) by mouth every 4 (four) hours as needed for severe pain (pain score 7-10). 11/07/23  Yes Lucretia Pendley, PA-C  pantoprazole  (PROTONIX ) 40 MG tablet Take 1 tablet (40 mg total) by mouth daily. 11/07/23  Yes Ceclia Cohens, Rain Friedt, PA-C  Clobetasol  Prop Emollient Base (CLOBETASOL  PROPIONATE E) 0.05 % emollient cream APPLY TO AFFECTED AREA TWICE A DAY 11/17/21   Adra Alanis, FNP  escitalopram  (LEXAPRO ) 10 MG tablet Take 1 tablet (10 mg total) by mouth daily. 05/13/21   Adra Alanis, FNP  famotidine (PEPCID) 20 MG tablet Take by mouth. Patient not taking: Reported on 01/29/2023 08/11/19   [provider]  gabapentin (NEURONTIN) 300 MG capsule  03/13/19    [provider]  hydrOXYzine  (ATARAX ) 10 MG tablet Take 1 tablet (10 mg total) by mouth 3 (three) times daily as needed for itching. 10/02/21   Versa Gore, NP  ketoconazole  (NIZORAL ) 2 % shampoo Apply 1 application. topically 2 (two) times a week. Patient not taking: Reported on 01/29/2023 09/04/21   Roemhildt, Lorin T, PA-C  methocarbamol (ROBAXIN) 750 MG tablet Take by mouth. Patient not taking: Reported on 02/26/2023    [provider]  metoprolol  succinate (TOPROL -XL) 25 MG 24 hr tablet TAKE 1 TABLET (25 MG TOTAL) BY MOUTH DAILY. 10/05/23   O'NealCathay Clonts, MD  rosuvastatin  (CRESTOR ) 10 MG tablet Take 1 tablet (10 mg total) by mouth daily. 05/13/21   Adra Alanis, FNP  triamcinolone  0.1%-silver  sulfadiazine  1:1 cream mixture Apply topically daily. Patient not taking: Reported on 01/29/2023 09/03/21   Roemhildt, Lorin T, PA-C  valACYclovir  (VALTREX ) 500 MG tablet Take 1 tablet po bid x 5 days prn with outbreak 01/31/21   Adra Alanis, FNP      Allergies    Cefepime and Hydrochlorothiazide    Review of Systems   Review of Systems  Constitutional:  Negative for fever.  Respiratory:  Negative for shortness of breath.   Cardiovascular:  Negative for chest pain.  Gastrointestinal:  Positive for abdominal pain and nausea. Negative for vomiting.  Genitourinary:  Positive for dysuria. Negative for flank pain.  Neurological:  Negative for light-headedness.  All other systems reviewed  and are negative.   Physical Exam Updated Vital Signs BP (!) 170/73   Pulse (!) 47   Temp 97.7 F (36.5 C) (Oral)   Resp 17   Ht 5\' 3"  (1.6 m)   Wt 80 kg   SpO2 97%   BMI 31.24 kg/m  Physical Exam Vitals and nursing note reviewed.  Constitutional:      General: She is not in acute distress.    Appearance: Normal appearance. She is not ill-appearing.  HENT:     Head: Normocephalic and atraumatic.     Nose: Nose normal.  Eyes:     Conjunctiva/sclera:  Conjunctivae normal.  Cardiovascular:     Rate and Rhythm: Normal rate and regular rhythm.  Pulmonary:     Effort: Pulmonary effort is normal. No respiratory distress.     Breath sounds: Normal breath sounds. No wheezing or rales.  Abdominal:     General: There is no distension.     Tenderness: There is abdominal tenderness. There is no right CVA tenderness, left CVA tenderness or guarding.  Musculoskeletal:        General: No deformity. Normal range of motion.     Cervical back: Normal range of motion.  Skin:    Findings: No rash.  Neurological:     Mental Status: She is alert.     ED Results / Procedures / Treatments   Labs (all labs ordered are listed, but only abnormal results are displayed) Labs Reviewed  COMPREHENSIVE METABOLIC PANEL WITH GFR - Abnormal; Notable for the following components:      Result Value   CO2 20 (*)    Glucose, Bld 107 (*)    Creatinine, Ser 1.03 (*)    Alkaline Phosphatase 36 (*)    Total Bilirubin 1.9 (*)    GFR, Estimated 56 (*)    All other components within normal limits  CBC - Abnormal; Notable for the following components:   WBC 25.0 (*)    All other components within normal limits  URINALYSIS, ROUTINE W REFLEX MICROSCOPIC - Abnormal; Notable for the following components:   APPearance HAZY (*)    Leukocytes,Ua LARGE (*)    Bacteria, UA RARE (*)    All other components within normal limits  CBC WITH DIFFERENTIAL/PLATELET - Abnormal; Notable for the following components:   WBC 23.8 (*)    Lymphs Abs 16.8 (*)    Monocytes Absolute 1.3 (*)    All other components within normal limits  I-STAT CG4 LACTIC ACID, ED - Abnormal; Notable for the following components:   Lactic Acid, Venous 0.3 (*)    All other components within normal limits  LIPASE, BLOOD  POC OCCULT BLOOD, ED  I-STAT CG4 LACTIC ACID, ED  TYPE AND SCREEN    EKG EKG Interpretation Date/Time:  Sunday Nov 07 2023 19:48:47 EDT Ventricular Rate:  47 PR Interval:  249 QRS  Duration:  145 QT Interval:  485 QTC Calculation: 429 R Axis:   -18  Text Interpretation: Sinus bradycardia Prolonged PR interval Right bundle branch block Left ventricular hypertrophy Anterolateral infarct, old Confirmed by Abner Hoffman [phone]) on 11/07/2023 7:50:16 PM  Radiology CT ABDOMEN PELVIS W CONTRAST Result Date: 11/07/2023 CLINICAL DATA:  Acute abdominal pain. EXAM: CT ABDOMEN AND PELVIS WITH CONTRAST TECHNIQUE: Multidetector CT imaging of the abdomen and pelvis was performed using the standard protocol following bolus administration of intravenous contrast. RADIATION DOSE REDUCTION: This exam was performed according to the departmental dose-optimization program which includes automated exposure control, adjustment  of the mA and/or kV according to patient size and/or use of iterative reconstruction technique. CONTRAST:  75mL OMNIPAQUE  IOHEXOL  350 MG/ML SOLN COMPARISON:  06/08/2019 FINDINGS: Lower chest: The heart is upper normal in size. There are hypoventilatory changes in the dependent lungs. Hepatobiliary: No focal liver abnormality. Cholecystectomy. Stable intra and extrahepatic biliary ductal dilatation, likely post cholecystectomy sequela. Pancreas: Unremarkable. No pancreatic ductal dilatation or surrounding inflammatory changes. Spleen: Normal in size without focal abnormality. Adrenals/Urinary Tract: No adrenal nodule. No hydronephrosis, renal calculi, or renal inflammation. Small cysts and low-density lesions too small to characterize. No further follow-up imaging is recommended. Unremarkable urinary bladder. Stomach/Bowel: The stomach is nondistended. There is a small duodenal diverticulum. No bowel obstruction or inflammatory change. Normal appendix visualized. Small to moderate volume of colonic stool. Mild left colonic diverticulosis. No diverticulitis. Vascular/Lymphatic: Aortic atherosclerosis. No aneurysm. Patent portal vein. There prominent bilateral external iliac nodes, age  measuring 12 mm. Reproductive: Status post hysterectomy. No adnexal masses. Other: No free air, free fluid, or intra-abdominal fluid collection. Musculoskeletal: Scoliosis and degenerative change in the spine. There are no acute or suspicious osseous abnormalities. IMPRESSION: 1. No acute abnormality or explanation for abdominal pain. 2. Mild left colonic diverticulosis without diverticulitis. 3. Prominent bilateral external iliac nodes, nonspecific, likely reactive. Aortic Atherosclerosis (ICD10-I70.0). Electronically Signed   By: Chadwick Colonel M.D.   On: 11/07/2023 19:15    Procedures Procedures    Medications Ordered in ED Medications  ondansetron  (ZOFRAN ) injection 4 mg (4 mg Intravenous Given 11/07/23 1554)  pantoprazole  (PROTONIX ) injection 40 mg (40 mg Intravenous Given 11/07/23 1555)  cefTRIAXone (ROCEPHIN) 1 g in sodium chloride  0.9 % 100 mL IVPB (0 g Intravenous Stopped 11/07/23 1926)  iohexol  (OMNIPAQUE ) 350 MG/ML injection 75 mL (75 mLs Intravenous Contrast Given 11/07/23 1738)  fentaNYL (SUBLIMAZE) injection 50 mcg (50 mcg Intravenous Given 11/07/23 1836)  ondansetron  (ZOFRAN ) injection 4 mg (4 mg Intravenous Given 11/07/23 1837)  alum & mag hydroxide-simeth (MAALOX/MYLANTA) 200-200-20 MG/5ML suspension 30 mL (30 mLs Oral Given 11/07/23 1950)    ED Course/ Medical Decision Making/ A&P                                 Medical Decision Making Amount and/or Complexity of Data Reviewed Labs: ordered. Radiology: ordered.  Risk OTC drugs. Prescription drug management.   Medical Decision Making / ED Course   This patient presents to the ED for concern of abdominal pain, dysuria, this involves an extensive number of treatment options, and is a complaint that carries with it a high risk of complications and morbidity.  The differential diagnosis includes UTI, gastroenteritis, gastritis, PUD, GI bleed, ACS  MDM: 78 year old female presents with above-mentioned  complaints.  Slightly hypertensive otherwise hemodynamically stable.  Admission considered but will reevaluate after labs and imaging.  CBC shows leukocytosis of 25,000 with no significant left shift.  Lipase within normal, without lactic acidosis, CMP shows creatinine of 1.03, glucose of 107 otherwise without acute concern.  Mildly elevated T. bili but LFTs otherwise normal.  UA does show evidence of UTI. Fecal Hemoccult negative.  EKG without acute ischemic change.  No chest pain or shortness of breath.  Low suspicion for ACS.  Low heart score.  CT abdomen pelvis without acute finding.  Discharged in stable condition.  Symptoms well-controlled.  Will give her Protonix  and Maalox for her history of peptic ulcer disease.  Discussed follow-up with her PCP within the  next 3 days.  They are in agreement.  Discharged in stable condition.   Additional history obtained: -Additional history obtained from son who is at bedside -External records from outside source obtained and reviewed including: Chart review including previous notes, labs, imaging, consultation notes   Lab Tests: -I ordered, reviewed, and interpreted labs.   The pertinent results include:   Labs Reviewed  COMPREHENSIVE METABOLIC PANEL WITH GFR - Abnormal; Notable for the following components:      Result Value   CO2 20 (*)    Glucose, Bld 107 (*)    Creatinine, Ser 1.03 (*)    Alkaline Phosphatase 36 (*)    Total Bilirubin 1.9 (*)    GFR, Estimated 56 (*)    All other components within normal limits  CBC - Abnormal; Notable for the following components:   WBC 25.0 (*)    All other components within normal limits  URINALYSIS, ROUTINE W REFLEX MICROSCOPIC - Abnormal; Notable for the following components:   APPearance HAZY (*)    Leukocytes,Ua LARGE (*)    Bacteria, UA RARE (*)    All other components within normal limits  CBC WITH DIFFERENTIAL/PLATELET - Abnormal; Notable for the following components:   WBC 23.8 (*)     Lymphs Abs 16.8 (*)    Monocytes Absolute 1.3 (*)    All other components within normal limits  I-STAT CG4 LACTIC ACID, ED - Abnormal; Notable for the following components:   Lactic Acid, Venous 0.3 (*)    All other components within normal limits  LIPASE, BLOOD  POC OCCULT BLOOD, ED  I-STAT CG4 LACTIC ACID, ED  TYPE AND SCREEN      EKG  EKG Interpretation Date/Time:  Sunday Nov 07 2023 19:48:47 EDT Ventricular Rate:  47 PR Interval:  249 QRS Duration:  145 QT Interval:  485 QTC Calculation: 429 R Axis:   -18  Text Interpretation: Sinus bradycardia Prolonged PR interval Right bundle branch block Left ventricular hypertrophy Anterolateral infarct, old Confirmed by Abner Hoffman [phone]) on 11/07/2023 7:50:16 PM         Imaging Studies ordered: I ordered imaging studies including CT abdomen pelvis with contrast I independently visualized and interpreted imaging. I agree with the radiologist interpretation   Medicines ordered and prescription drug management: Meds ordered this encounter  Medications   ondansetron  (ZOFRAN ) injection 4 mg   pantoprazole  (PROTONIX ) injection 40 mg   cefTRIAXone (ROCEPHIN) 1 g in sodium chloride  0.9 % 100 mL IVPB    Antibiotic Indication::   UTI   iohexol  (OMNIPAQUE ) 350 MG/ML injection 75 mL   fentaNYL (SUBLIMAZE) injection 50 mcg   ondansetron  (ZOFRAN ) injection 4 mg   alum & mag hydroxide-simeth (MAALOX/MYLANTA) 200-200-20 MG/5ML suspension 30 mL   alum & mag hydroxide-simeth (MAALOX MAX) 400-400-40 MG/5ML suspension    Sig: Take 15 mLs by mouth every 6 (six) hours as needed for indigestion.    Dispense:  355 mL    Refill:  0    Supervising Provider:   MILLER, BRIAN [[id number]]   pantoprazole  (PROTONIX ) 40 MG tablet    Sig: Take 1 tablet (40 mg total) by mouth daily.    Dispense:  30 tablet    Refill:  1    Supervising Provider:   Annabell Key, BRIAN [[id number]]   cephALEXin (KEFLEX) 500 MG capsule    Sig: Take 1 capsule (500 mg total) by mouth  3 (three) times daily for 7 days.    Dispense:  21 capsule  Refill:  0    Tolerated rocephin in the ED without issue. Allergy only seems to be with cefepime.    Supervising Provider:   Early Glisson [[id number]]   oxyCODONE  (ROXICODONE ) 5 MG immediate release tablet    Sig: Take 1 tablet (5 mg total) by mouth every 4 (four) hours as needed for severe pain (pain score 7-10).    Dispense:  10 tablet    Refill:  0    Supervising Provider:   Early Glisson [[id number]]    -I have reviewed the patients home medicines and have made adjustments as needed  Social Determinants of Health:  Factors impacting patients care include: Good family support   Reevaluation: After the interventions noted above, I reevaluated the patient and found that they have :improved  Co morbidities that complicate the patient evaluation  Past Medical History:  Diagnosis Date   Chronic neck and back pain    Hypertension    Leukemia (HCC)    No chemo      Dispostion: Discharged in stable condition.  Return precaution discussed.  Patient voices understanding and is in agreement with plan.   Final Clinical Impression(s) / ED Diagnoses Final diagnoses:  Abdominal pain, unspecified abdominal location  Acute cystitis with hematuria  Gastroesophageal reflux disease, unspecified whether esophagitis present    Rx / DC Orders ED Discharge Orders          Ordered    alum & mag hydroxide-simeth (MAALOX MAX) 400-400-40 MG/5ML suspension  Every 6 hours PRN        11/07/23 2031    pantoprazole  (PROTONIX ) 40 MG tablet  Daily        11/07/23 2031    cephALEXin (KEFLEX) 500 MG capsule  3 times daily       Note to Pharmacy: Tolerated rocephin in the ED without issue. Allergy only seems to be with cefepime.   11/07/23 2031    oxyCODONE  (ROXICODONE ) 5 MG immediate release tablet  Every 4 hours PRN        11/07/23 2032              Lucina Sabal, PA-C 11/07/23 2038    Carin Charleston, MD 11/07/23 [phone]

## 2023-11-07 NOTE — ED Triage Notes (Signed)
 Pt c/o pain in abdomen x 1 week. Pain has worsened. Pt has had some diarrhea, states stool is Janelys Glassner. Pt has had nausea.

## 2023-11-07 NOTE — ED Notes (Signed)
 BP 185/85 Bethany Travis, [place] notified.

## 2023-11-07 NOTE — Discharge Instructions (Addendum)
 No concerning findings on the CT scan.  Your urine did show evidence of urinary tract infection.  Given your history of gastric ulcer I have sent in Protonix  and Maalox into the pharmacy for you.  Follow-up with your primary care doctor preferably within the next 3 days.  Return to the emergency department for any worsening symptoms. Short course of pain medicine sent into the pharmacy for you.  Do not drive or do anything else dangerous after taking this medicine.

## 2023-12-31 ENCOUNTER — Other Ambulatory Visit: Payer: Self-pay | Admitting: Cardiovascular Disease

## 2024-01-26 ENCOUNTER — Other Ambulatory Visit: Payer: Self-pay | Admitting: Cardiovascular Disease

## 2024-02-03 ENCOUNTER — Other Ambulatory Visit: Payer: Self-pay | Admitting: Cardiovascular Disease

## 2024-02-18 ENCOUNTER — Other Ambulatory Visit: Payer: Self-pay | Admitting: Cardiology

## 2024-02-22 ENCOUNTER — Other Ambulatory Visit: Payer: Self-pay

## 2024-02-22 MED ORDER — METOPROLOL SUCCINATE ER 25 MG PO TB24
25.0000 mg | ORAL_TABLET | Freq: Every day | ORAL | 0 refills | Status: DC
Start: 2024-02-22 — End: 2024-03-16

## 2024-02-22 NOTE — Telephone Encounter (Signed)
 Pt of Dr. Barbaraann. Overdue and past her 3rd attempt. Does Dr. Barbaraann want to refill? Please advise.

## 2024-03-16 ENCOUNTER — Other Ambulatory Visit: Payer: Self-pay | Admitting: Cardiovascular Disease

## 2024-03-27 ENCOUNTER — Other Ambulatory Visit: Payer: Self-pay | Admitting: Cardiovascular Disease

## 2024-04-04 ENCOUNTER — Other Ambulatory Visit: Payer: Self-pay | Admitting: Family Medicine

## 2024-04-04 DIAGNOSIS — Z1231 Encounter for screening mammogram for malignant neoplasm of breast: Secondary | ICD-10-CM

## 2024-04-06 ENCOUNTER — Other Ambulatory Visit: Payer: Self-pay | Admitting: Cardiovascular Disease

## 2024-04-13 ENCOUNTER — Other Ambulatory Visit: Payer: Self-pay | Admitting: Family Medicine

## 2024-04-13 ENCOUNTER — Encounter: Payer: Self-pay | Admitting: Family Medicine

## 2024-04-13 DIAGNOSIS — N6320 Unspecified lump in the left breast, unspecified quadrant: Secondary | ICD-10-CM

## 2024-05-05 ENCOUNTER — Ambulatory Visit
Admission: RE | Admit: 2024-05-05 | Discharge: 2024-05-05 | Disposition: A | Discharge status: Home / Self Care | Source: Ambulatory Visit | Attending: Family Medicine | Admitting: Family Medicine

## 2024-05-05 ENCOUNTER — Other Ambulatory Visit: Payer: Self-pay | Admitting: Family Medicine

## 2024-05-05 DIAGNOSIS — N6320 Unspecified lump in the left breast, unspecified quadrant: Secondary | ICD-10-CM
